# Patient Record
Sex: Female | Born: 1945 | Race: White
Health system: Southern US, Community
[De-identification: ages and names within clinical notes are randomized; demographics above are authoritative.]

## PROBLEM LIST (undated history)

## (undated) DIAGNOSIS — Z952 Presence of prosthetic heart valve: Secondary | ICD-10-CM

## (undated) HISTORY — DX: Presence of prosthetic heart valve: Z95.2

---

## 2017-07-01 IMAGING — CT CTA NECK
2 of 5 series · 13 of 46 positions shown, 15 images · IV contrast (ISOVUE 300)
Comparison: 

CTA NECK, 07/01/2017 [DATE]: 
CLINICAL INDICATION: Assess carotid artery stenosis. Outside carotid 
ultrasound 
showing 50-69% left internal carotid stenosis. Patient describes dizziness x2 
years. 
A search for DICOM formatted images was conducted for prior CT imaging studies 
completed at a non-affiliated media free facility.
TECHNIQUE: The neck was scanned from the maxillary sinuses through AP window 
with 100 cc's of Isovue 370 injected intravenously on a high resolution low 
dose 
CT scanner using dose reduction techniques.  Routine MPR and MIP 3D renderings 
were reconstructed on an independent workstation with concurrent physician 
supervision.

[Series 5: (person_name) 1.5 b26s · axial · 0.33mm/px · z∈[-316,-68]mm · 10 of 191 slices shown, 12 images]
[im 13/191  soft-tissue]
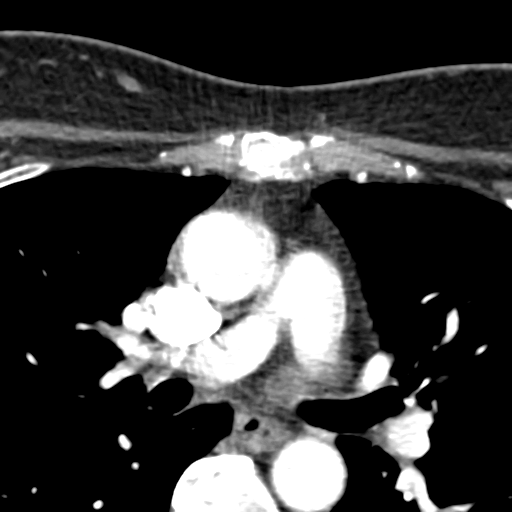
[im 13/191  bone]
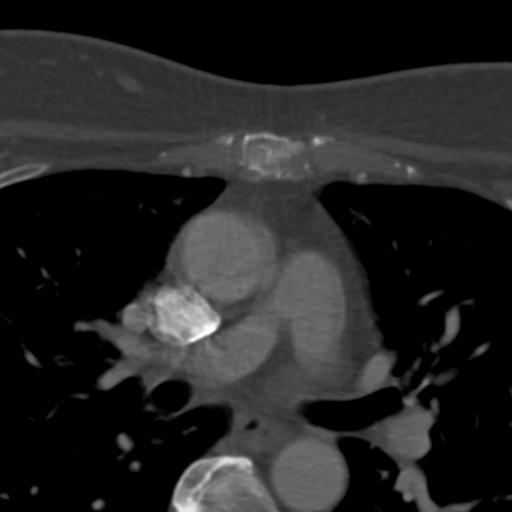
[im 31/191  soft-tissue]
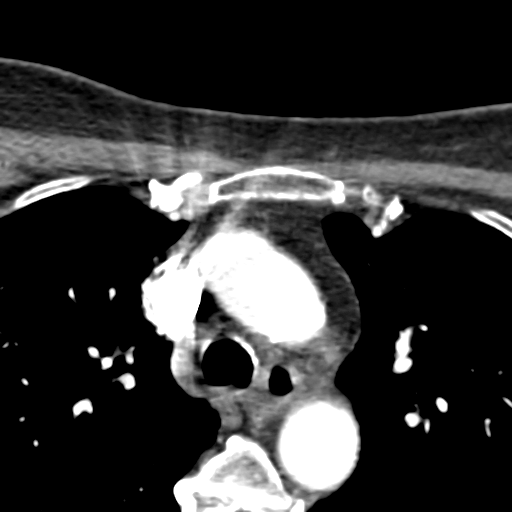
[im 50/191  soft-tissue]
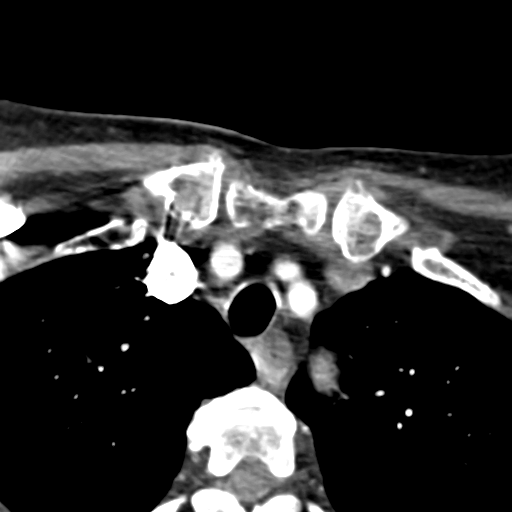
[im 68/191  soft-tissue]
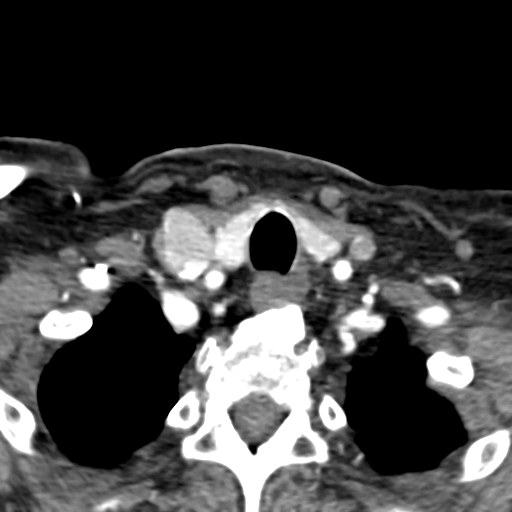
[im 86/191  soft-tissue]
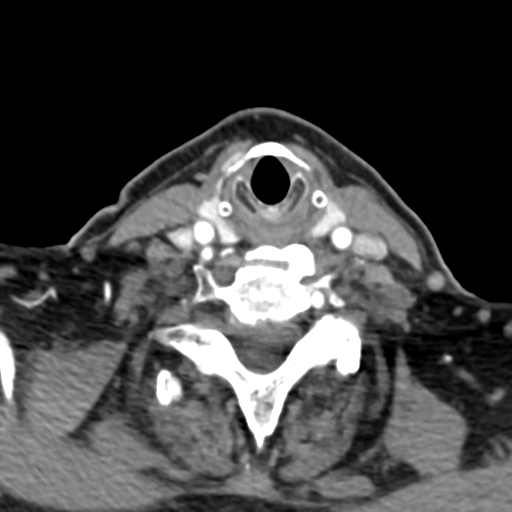
[im 105/191  soft-tissue]
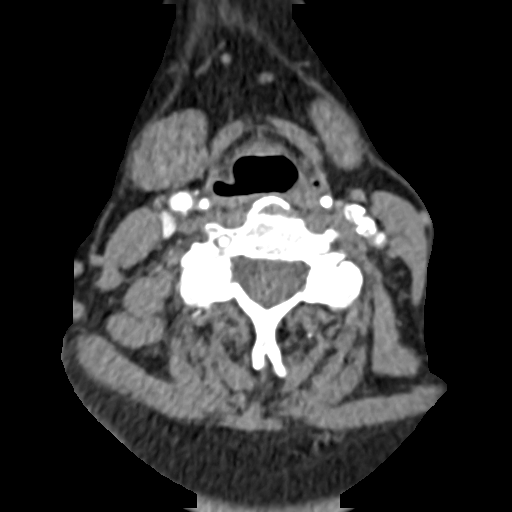
[im 123/191  soft-tissue]
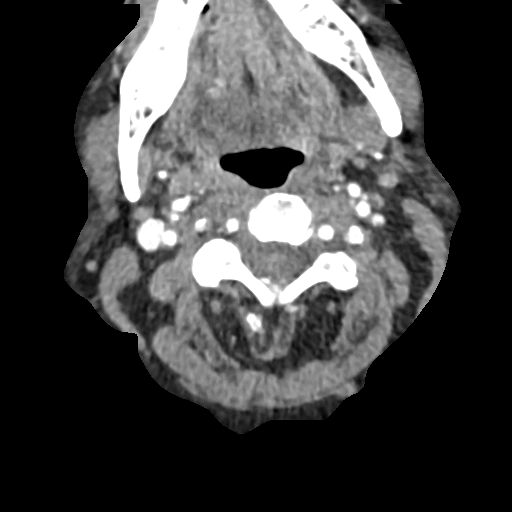
[im 141/191  soft-tissue]
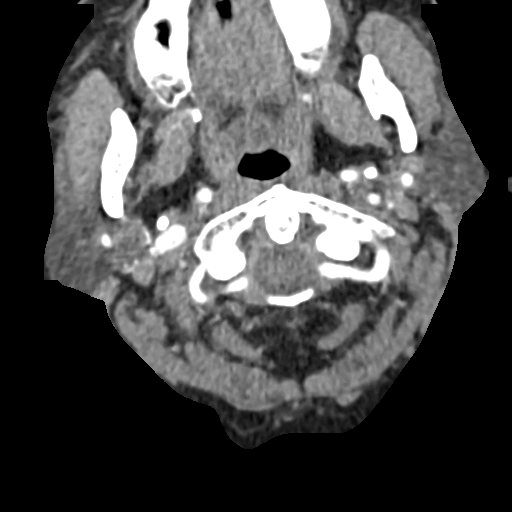
[im 160/191  soft-tissue]
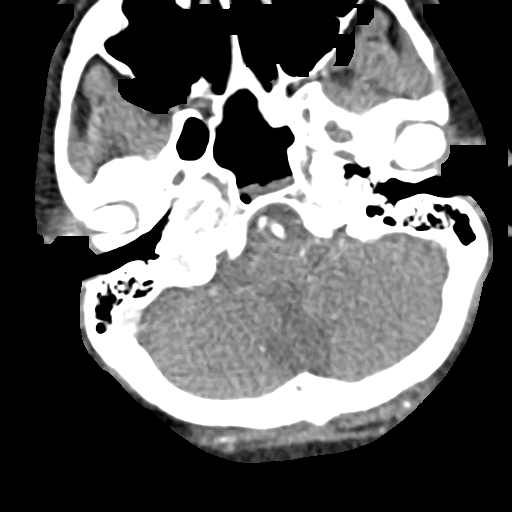
[im 160/191  bone]
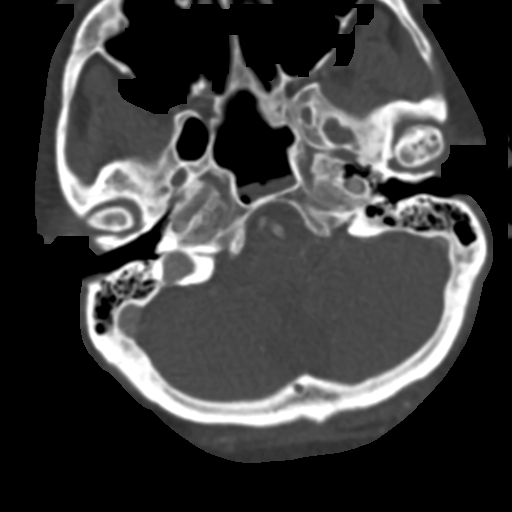
[im 178/191  soft-tissue]
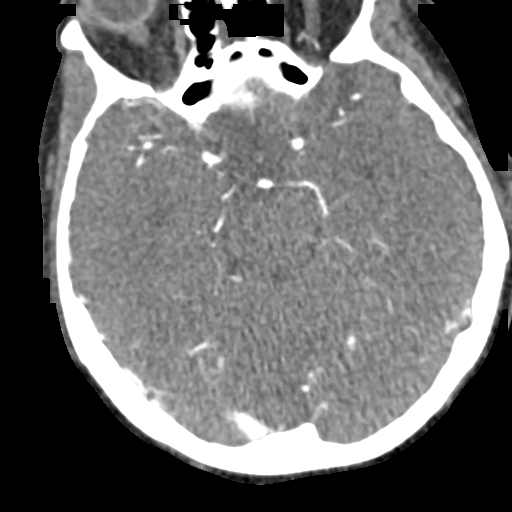

[Series 7: coronal · coronal · 0.32mm/px · 3 of 101 slices shown]
[im 34/101  soft-tissue]
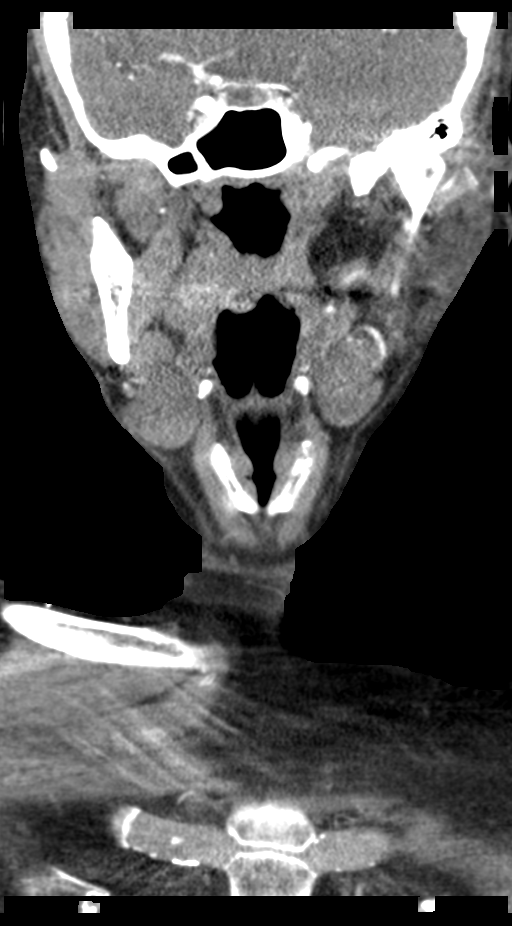
[im 45/101  soft-tissue]
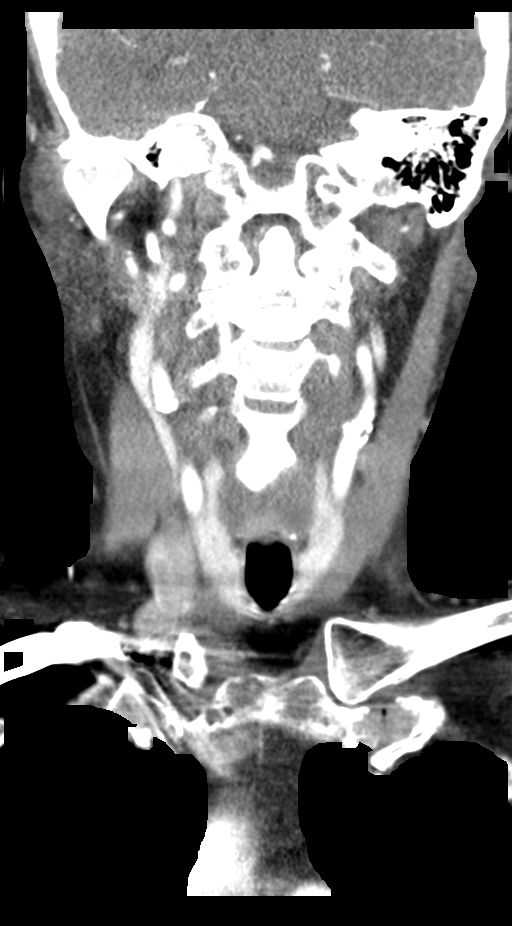
[im 56/101  soft-tissue]
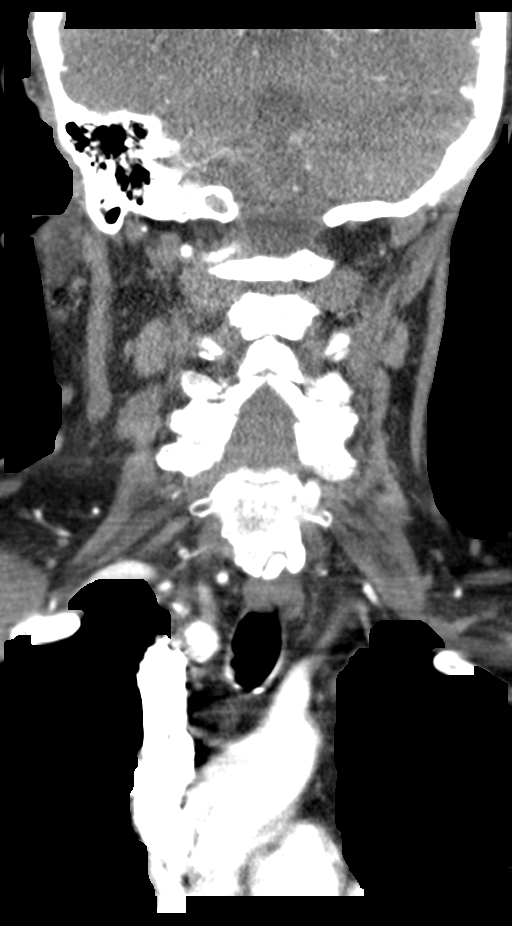

[13 of 46 positions shown; findings below may reference images not displayed]

FINDINGS: The great vessels are open. The brachiocephalic and right common 
carotid arteries are open. There is moderate partly calcified plaque involving 
the right carotid bulb, with stenosis no greater than 40% using modified 
NASCET 
criteria. Above the bulb there is antegrade flow to the skull base. There is 
moderate plaque in the right cavernous and supraclinoid segments without 
significant intracranial stenosis. The right anterior and middle cerebral 
arteries are open. 
The left common carotid artery is open. There is extensive calcific plaque 
involving the left internal carotid bulb and proximal left external carotid 
artery. Lumen diameter of the left internal carotid artery at the origin of 
the 
vessel is no greater than 1.2 mm, axial image 88, also seen on sagittal image 
35, and coronal image 46. Stenosis is estimated at 85-90%. At the upper bulb 
stenosis of 70%. Above the bulb the left internal carotid artery is open. 
There 
is antegrade flow to the skull base. There is mild to moderate plaque in the 
left cavernous segment. There is hypoplasia of the left A1 segment. The left 
middle cerebral artery is unremarkable. The left A2 is open. The anterior 
communicating artery appears open. 
The left vertebral artery is open and dominant. The right vertebral artery is 
open. There is mild to moderate calcific plaque involving the distal vertebral 
arteries best seen on axial image 41, without significant stenosis on the 
left. 
The basilar artery is open. The posterior cerebral arteries arise from the 
basilar tip. There is no evidence for intracranial aneurysm. There are mild 
mucoid secretions in the sphenoid sinus. There is cervical spondylosis. Upper 
visualized lungs are clear. Lower cranial contents are unremarkable. There is 
no 
cervical or upper mediastinal lymphadenopathy.
IMPRESSION: Hemodynamically significant stenosis of the left internal carotid bulb 
estimated 
at 85-90%. 
No greater than 40% stenosis of the right internal carotid bulb. 
Both vertebral arteries are open, the left dominant. The basilar artery is 
open. 
Mild to moderate plaque in the cavernous carotid segments, and right 
supraglenoid segment. No evidence for hemodynamically significant intracranial 
stenosis or aneurysm. Note made of hypoplasia of the left A1 segment. The 
anterior communicating artery is open. 
Mild mucoid secretions in the sphenoid sinus could reflect recent/resolving 
sinusitis. Other visualized paranasal sinuses and otomastoid spaces are clear. 
Cervical spondylosis. There does not appear to be significant cervical canal 
stenosis. 
Measurements performed utilizing modified NASCET criteria. 
RADIATION DOSE REDUCTION: All CT scans are performed using radiation dose 
reduction techniques, when applicable.  Technical factors are evaluated and 
adjusted to ensure appropriate moderation of exposure.  Automated dose 
management technology is applied to adjust the radiation doses to minimize 
exposure while achieving diagnostic quality images.

## 2019-09-18 IMAGING — CT CT TAVR
1 of 14 series · 3 of 16 positions shown, 4 images · IV contrast (ISOVUE 300)
Comparison: none

CT TAVR, 09/18/2019 [DATE] 
CLINICAL INDICATION:
TECHNIQUE: CTA of the chest, heart, abdomen, and pelvis were scanned from base of neck 
through the pubic rami with 100 CC Isovue 370, on a high resolution CT 
scanner using dose reduction techniques.? 0.75mm sections with contrast 
enhancement and maximum intensity pixel (MIPS) reconstruction. 
Dose: CTIVol 56.15 mGy 
Heart rate 63  beats per minute 
Reconstructed at 30 % of R-R interval 
Hydration: NACL 0.9%-250 cc at [DATE] 
Metroprolol 00 milligrams at 
Specific measurements for assessment for TAVR placement are as follows (measured 
at the narrowest point of the lumen of the measured segment): 
Distance of left main to annulus: 12.2 mm 
Distance of RCA to annulus: 10.3 mm 
Aortic diameter at the annulus: 27.3 mm 
Annulus circumference: mm 
Annulus area: 79.6 cm2 
Aortic diameter at level of sinus of valsalva: 32.7 mm 
Aortic diameter at the sinotubular junction: 24.1 mm 
Aortic diameter proximal ascending aorta: 28.8 mm 
Aortic diameter mid ascending aorta: 29.8 mm 
Aortic diameter distal ascending aorta: 27.2 mm 
Aortic diameter mid arch level: 22.1 mm 
Aortic diameter proximal descending aorta: 21.1 mm 
Aortic diameter distal abdominal aorta: 20.6 mm 
Right common iliac artery: 9.7 mm 
Right external iliac artery: 7.8 mm 
Right common femoral artery: 6.2 mm 
Left common iliac artery: 8.5 mm 
Left external iliac artery: 8.2 mm 
Left common femoral artery: 6.0 mm 
Aortic lumen: Mild atherosclerotic changes 
There is mild atherosclerotic changes in the aorta root. 
Nonvascular findings: 
Chest: There is minor linear scarring versus atelectasis in the right lower 
lobe, lungs are otherwise clear without mass or consolidations. No mediastinal, 
no axillary and no hilar adenopathy. Heart size normal. No pulmonary embolus and 
no thoracic aortic dissection or aneurysm. 
Abdomen: The liver, spleen, pancreas, adrenals and kidneys are normal. There are 
mild atherosclerotic changes of the abdominal aorta without aneurysm or 
stenosis. The superior mesenteric artery, celiac artery and renal arteries are 
patent. Stomach is partially distended and is difficult to assess. No 
para-aortic adenopathy. 
Pelvis: The urinary bladder is normal. Uterus and adnexa are normal. Bowel loops 
in pelvis appear normal. The iliac arteries have minor atherosclerotic changes 
without significant stenosis or tortuosity. No pelvic mass or free fluid in 
pelvis and no pelvic adenopathy. No lytic or blastic lesions are seen in the 
chest, abdomen or pelvis.

[Series 12: bodyangio 1.0 i31s 3 · axial · 0.86mm/px · z∈[-564,+154]mm · 3 of 1437 slices shown, 4 images]
[im 1/1437  soft-tissue]
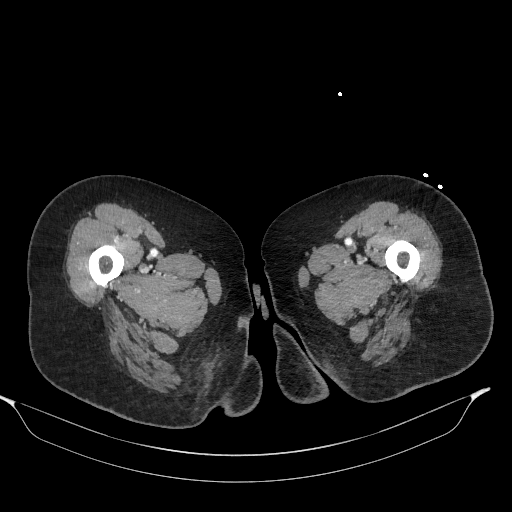
[im 1/1437  bone]
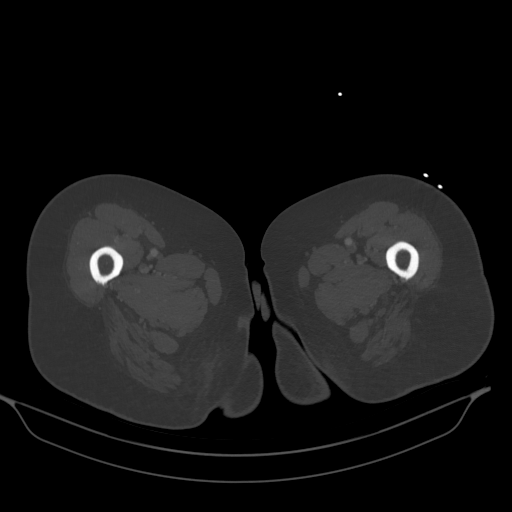
[im 719/1437  soft-tissue]
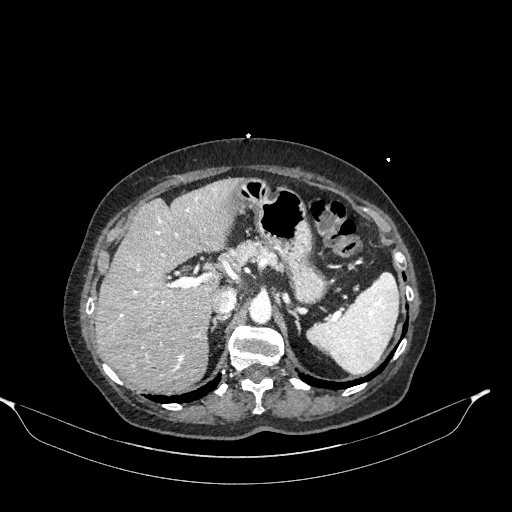
[im 1437/1437  soft-tissue]
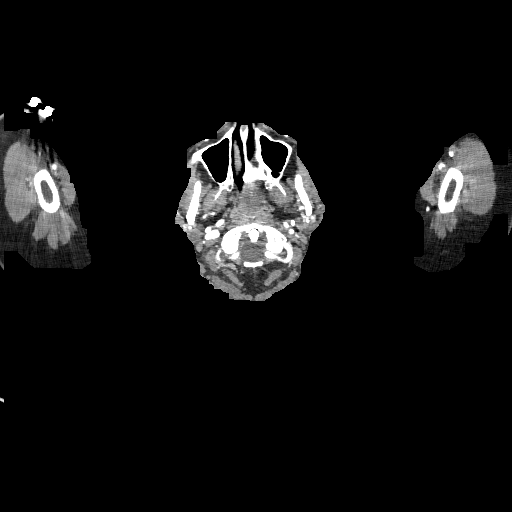

[3 of 16 positions shown; findings below may reference images not displayed]

IMPRESSION: TAVR measurements as described above. 
No stenosis or tortuosity which would preclude lower extremity access for TAVR 
procedure.

## 2021-02-12 NOTE — Progress Notes (Signed)
Name:Brenda Stevens Age: 75 y.o.   Date of Service: 02/13/2021  Referring Physician: Referring, Not On File,*   Date of Injury: 01/13/2021  Date Care Plan Established/Reviewed: 02/13/2021  Date Treatment Started: 02/13/2021  End of Certification Date: 05/13/2021  Sessions in Plan of Care: 16  Surgery Date: No data was found  MD Follow-up: No data was found  Medbridge Code: No data was found    Visit Count: 1   Diagnosis:   1. Cervical pain      *Pt goes by Brenda Stevens    Subjective     History of Present Illness   History of Present Illness: Pt reports she has mobility issues and feels like she compensates with her neck. She lives in [place] part time in winter. She has been told she has arthritis. Feels a lot of stiffness in the neck. She has fallen a few times in the last year. She does a lot of walking. Pt has previous dx of mal de debarquement, is cause of mobility issues, has no relief. She has done vestibular rehab before which didn't help. Pt reports last year and a half the neck has gotten worse overall but does have instances where get can get better or worse. Pt has N/T into the R hand, recently has become more frequent especially at night. She has cramping in legs at night. Pt has not had recently imaging done, but did have xray in the past.   Functional Limitations (PLOF): Driving/turning head- Pain and difficulty (prior no issues)  Walking- Pain and difficulty (prior no issues)  Looking up and down- Pain and difficulty (prior no issues)  Using phone and computer- Pain and difficulty (prior no issues)      Precautions: No data was found  Allergies: Patient has no allergy information on record.    No past medical history on file.      Outcomes:  Goal: 66 Initial  02/13/21             Primary Functional Status Measure 55       NDI        Pain: /10 /10 /10 /10 /10   Pain last 24 hrs        Least pain last 30 days        Greatest pain last 30 days        PSFS Activities: /10 /10 /10 /10 /10   1.         2.         3.            Cervical AROM  Initial   02/13/21         Flexion 30 p        Extension 20 p        Side Bend R 15 p       Side Bend L 5 p       Rotation R 40 p        Rotation L 40 p        (blank fields were intentionally left blank)    UE Strength  PSI Initial  R  02/13/21   Initial   L  02/13/21     R   L   R   L   R   L   R   L   Shoulder Flex 5.6 p 7.6 p           Shoulder Abd (C5) 4.6 p 5.4 p  Shoulder Ext             Shoulder ER             Shoulder IR             Rhomboids             Serratus             Upper Trap (C4) 4.6 p 6.0 p           Mid Trap (C4) sitting              Lower Trap (C4)             Biceps (C6)             Triceps (C7)             Wrist Ext (C7)             Wrist Flex (C7)             (blank fields were intentionally left blank)       Objective     Posture     Head  Forward.    Shoulders  Rounded.    Scapulae  Left protracted and right protracted.    Integumentary   no wound, lesion or rash noted    Palpation Negative spurling, positive distraction/compression  TTP and tightness SCM, scalenes, LS, UT, thoracic and cervical paraspinals  b/l   Elevated and TTP 1st rib b/l   TTP C4-6, hypomobility throughout cervical and thoracic spine  R shoulder elevated and protracted compared to L       Neurological Testing     Sensation   Cervical/Thoracic   Left   Intact: light touch    Right   Intact: light touch     BP: 132/79 Heart Rate: 67    Treatment     Therapeutic Exercises - Justified to address any of the following: develop strength, endurance, ROM and/or flexibility.   Instructed in the following for HEP:    Access Code: [email or web address]  URL: [email or web address]  Date: 02/13/2021  Prepared by: Estrellita Ludwig    Exercises  Axial elongation - 1 x daily - 7 x weekly - 3 sets - 10 reps  Seated Scapular Retraction - 1 x daily - 7 x weekly - 1 sets - 10 reps - 10 hold  Supine Cervical Rotation AROM on Pillow - 1 x daily - 7 x weekly - 3 sets - 10 reps  Seated Cervical Sidebending AROM - 1  x daily - 7 x weekly - 3 sets - 10 reps      Therapeutic Activity - Justified to address the following: activities to improve functional performance.  Pt educated on condition, anatomy, purpose of PT, timeline for recovery.        ---    Flowsheet Row ---   Total Time    Timed Minutes 20 minutes   Untimed Minutes 23 minutes   Total Time 43 minutes        Assessment   Brenda Stevens is a 75 y.o. female presenting with cervical pain who requires Physical Therapy for the following:  Impairments: Impaired posture, decreased strength, decreased ROM, joint hypomobility, muscle hypertonicity, decreased activity tolerance    Pain located: b/l SO area    Clinical presentation: stable - predictable pattern of recovery   Barriers to therapy: Time since onset of injury/illness/exacerbation - acute exacerbation of chronic issue   Comorbidities - mal  de debarquement , Meds for HBP   Functional Limitations (PLOF): Driving/turning head- Pain and difficulty (prior no issues)  Walking- Pain and difficulty (prior no issues)  Looking up and down- Pain and difficulty (prior no issues)  Using phone and computer- Pain and difficulty (prior no issues)  Patient is aware of diagnosis, prognosis and consents to plan of care: Yes  Plan   Visits per week: 2  Number of Sessions: 16  Direct One on One  21308: Therapeutic Exercise: To Develop Strength and Endurance, ROM and Flexibility  [id number]: Gait Training  65784: Neuromuscular Reeducation  97140: Manual Therapy techniques (mobilization, manipulation, manual traction) (STM: cervical and shoulder muscles, Joint mobs: cervical, shoulder, scapular, all palnes grade 1-4 )  97530: Therapeutic Activities: Dynamic activities to improve functional performance  Dry Needling  Supervised Modalities  97010: Thermal modalities: hot/cold packs  69629: Electrical stimulation  97016: Vasopneumatic devices  Next Session: gentle cervical mobility, joint mobs cervical and thoracic, STM cervical muscle, scapular stability  and deep neck flexor exercises, assess ULTT       Goals    Goal 1: Improve Physical FS Primary Measure from 55 to 66 or better to demonstrate change for significant functional improvement.      Sessions: 16      Goal 2: Patient will demonstrate independence in prescribed HEP with proper form, sets and reps for safe discharge to an independent program.   Sessions: 16      Goal 3: Patient will demonstrate cervical rotation AROM of greater than or equal to 70 degrees bilaterally to increase visual field to allow patient to check blind spots safely, change lanes safely, and back out of parking spots without pain         Sessions: 16      Goal 4: Patient will demonstrate >15 PSI of flexion and abduction to improve dynamic stability to allow patient to lift groceries from floor without pain         Sessions: 16                                Estrellita Ludwig, DPT

## 2021-02-13 ENCOUNTER — Inpatient Hospital Stay: Payer: Medicare Other | Admitting: Physical Therapist

## 2021-02-13 DIAGNOSIS — M542 Cervicalgia: Secondary | ICD-10-CM | POA: Insufficient documentation

## 2021-02-17 NOTE — PT/OT Therapy Note (Signed)
Name:Brenda Stevens Age: 75 y.o.   Date of Service: 02/18/2021  Referring Physician: Referring, Not On File,*   Date of Injury: 01/13/2021  Date Care Plan Established/Reviewed: 02/13/2021  Date Treatment Started: 02/13/2021  End of Certification Date: 05/13/2021  Sessions in Plan of Care: 16  Surgery Date: No data was found  MD Follow-up: No data was found  Medbridge Code: No data was found    Visit Count: 2   Diagnosis:   1. Cervical pain        Subjective     Daily Subjective   Pt reports that she was really sore after IE. After that she feels like stretching did help.     Social Support/Occupation      Occupation: retired      Precautions: No data was found  Allergies: Patient has no allergy information on record.    No past medical history on file.    Initial Evaluation Reference and/or Current Measurements(as dated):    Outcomes:  Goal: 66 Initial  02/13/21             Primary Functional Status Measure 55       NDI        Pain: /10 /10 /10 /10 /10   Pain last 24 hrs        Least pain last 30 days        Greatest pain last 30 days        PSFS Activities: /10 /10 /10 /10 /10   1.         2.         3.           Cervical AROM  Initial   02/13/21         Flexion 30 p        Extension 20 p        Side Bend R 15 p       Side Bend L 5 p       Rotation R 40 p        Rotation L 40 p        (blank fields were intentionally left blank)    UE Strength  PSI Initial  R  02/13/21   Initial   L  02/13/21     R   L   R   L   R   L   R   L   Shoulder Flex 5.6 p 7.6 p           Shoulder Abd (C5) 4.6 p 5.4 p           Shoulder Ext             Shoulder ER             Shoulder IR             Rhomboids             Serratus             Upper Trap (C4) 4.6 p 6.0 p           Mid Trap (C4) sitting              Lower Trap (C4)             Biceps (C6)              Triceps (C7)  Wrist Ext (C7)             Wrist Flex (C7)             (blank fields were intentionally left blank)       Objective     Posture     Head  Forward.    Shoulders  Rounded.    Scapulae  Left protracted and right protracted.    Integumentary   no wound, lesion or rash noted    Palpation Negative spurling, positive distraction/compression  TTP and tightness SCM, scalenes, LS, UT, thoracic and cervical paraspinals  b/l   Elevated and TTP 1st rib b/l   TTP C4-6, hypomobility throughout cervical and thoracic spine  R shoulder elevated and protracted compared to L                   Treatment     Therapeutic Exercises - Justified to address any of the following: develop strength, endurance, ROM and/or flexibility.   UBE L3 X 6 min (3 min fwd/3 min backwards) w/subjective discussion to guide treatment session    Added:  Side lying open book x 10each side, VC for head following hand   Side lying shoulder ER, ab, flex #1 x 10, b/l, VC for scapular positioning     Performed and reviewed HEP for compliance and proper performance:    Axial elongation - 1 x daily - 7 x weekly - 3 sets - 10 reps  Seated Scapular Retraction - 1 x daily - 7 x weekly - 1 sets - 10 reps - 10 hold  Supine Cervical Rotation AROM on Pillow - 1 x daily - 7 x weekly - 3 sets - 10 reps  Seated Cervical Sidebending AROM - 1 x daily - 7 x weekly - 3 sets - 10 reps    Manual Therapy - Justified to address any of the following:  Mobilization of joints and soft tissues, manipulation, manual lymphatic drainage, and/or manual traction.    Supine:    PPM cervical rotation and sidebending grade 3 with manual scapular depression   STM b/l UT, LS, cervical extensors, SCM, scalene, SO  SOR with manual cervical traction grade 3   1st rib mob inf grade 3 b/l         Home Exercises   Access Code: [email or web address]  URL: [email or web address]  Date: 02/13/2021  Prepared by: Estrellita Ludwig    Exercises  Axial elongation - 1 x daily - 7 x weekly - 3 sets - 10 reps  Seated Scapular Retraction - 1 x daily - 7 x weekly - 1 sets - 10 reps - 10 hold  Supine Cervical Rotation AROM on Pillow - 1 x daily - 7 x weekly - 3 sets - 10 reps  Seated Cervical Sidebending AROM - 1 x daily - 7 x weekly - 3 sets - 10 reps       ---    Flowsheet Row ---   Total Time    Timed Minutes 40 minutes   Total Time 40 minutes        Assessment   Pt educated on importance of daily/consistent performance of HEP in order to reach goals and return to PLOF.   Tightness b/l UT, LS, and SO improved post MT  Pt appropriately challenged side lying scapular stability exercises, good carryover with VC/TC for scapular retraction and depression  Noted limited ER ROM on R   Pt would benefit from continued skilled PT  to address impairments noted above and progress towards functional goals in order to return to PLOF.     Plan   Continue per POC- continue STM, cervical mobility, deep neck flexor and scapular stability exercises       Goals    Goal 1: Improve Physical FS Primary Measure from 55 to 66 or better to demonstrate change for significant functional improvement.      Sessions: 16      Goal 2: Patient will demonstrate independence in prescribed HEP with proper form, sets and reps for safe discharge to an independent program.   Sessions: 16      Goal 3: Patient will demonstrate cervical rotation AROM of greater than or equal to 70 degrees bilaterally to increase visual field to allow patient to check blind spots safely, change lanes safely, and back out of parking spots without pain         Sessions: 16      Goal 4: Patient will demonstrate >15 PSI of flexion and abduction to improve dynamic stability to allow patient to lift groceries from floor without pain         Sessions: 16                                Estrellita Ludwig, DPT

## 2021-02-18 ENCOUNTER — Inpatient Hospital Stay: Payer: Medicare Other | Admitting: Physical Therapist

## 2021-02-18 DIAGNOSIS — M542 Cervicalgia: Secondary | ICD-10-CM

## 2021-02-25 ENCOUNTER — Inpatient Hospital Stay: Payer: Medicare Other

## 2021-02-25 DIAGNOSIS — M542 Cervicalgia: Secondary | ICD-10-CM

## 2021-02-25 NOTE — Addendum Note (Signed)
Addended by: Estrellita Ludwig T. on: 02/25/2021 12:03 PM     Modules accepted: Orders

## 2021-02-25 NOTE — PT/OT Therapy Note (Signed)
Name:Brenda Stevens Age: 75 y.o.   Date of Service: 02/25/2021  Referring Physician: Bertram Gala,*   Date of Injury: 01/13/2021  Date Care Plan Established/Reviewed: 02/13/2021  Date Treatment Started: 02/13/2021  End of Certification Date: 05/13/2021  Sessions in Plan of Care: 16  Surgery Date: No data was found  MD Follow-up: No data was found  Medbridge Code: No data was found    Visit Count: 3   Diagnosis:   1. Cervical pain        Subjective     Daily Subjective   Pt reports she feels like things have got a little better, still stiffens up on the R side of the neck L side has been good.     Social Support/Occupation              Occupation: retired    Precautions: No data was found  Allergies: Patient has no allergy information on record.    No past medical history on file.    Initial Evaluation Reference and/or Current Measurements(as dated):    Outcomes:  Goal: 66 Initial  02/13/21                    Primary Functional Status Measure 55           NDI             Pain: /10 /10 /10 /10 /10   Pain last 24 hrs             Least pain last 30 days             Greatest pain last 30 days             PSFS Activities: /10 /10 /10 /10 /10   1.              2.              3.                 Cervical AROM  Initial   02/13/21              Flexion 30 p            Extension 20 p            Side Bend R 15 p           Side Bend L 5 p           Rotation R 40 p            Rotation L 40 p            (blank fields were intentionally left blank)     UE Strength  PSI Initial  R  02/13/21    Initial   L  02/13/21       R    L    R    L    R    L    R    L   Shoulder Flex 5.6 p 7.6 p                   Shoulder Abd (C5) 4.6 p 5.4 p                   Shoulder Ext                       Shoulder ER  Shoulder IR                       Rhomboids                       Serratus                       Upper Trap (C4) 4.6 p 6.0 p                   Mid Trap (C4) sitting                        Lower Trap (C4)                        Biceps (C6)                       Triceps (C7)                       Wrist Ext (C7)                       Wrist Flex (C7)                       (blank fields were intentionally left blank)         Objective      Posture      Head  Forward.     Shoulders  Rounded.     Scapulae  Left protracted and right protracted.     Integumentary   no wound, lesion or rash noted     Palpation Negative spurling, positive distraction/compression  TTP and tightness SCM, scalenes, LS, UT, thoracic and cervical paraspinals  b/l   Elevated and TTP 1st rib b/l   TTP C4-6, hypomobility throughout cervical and thoracic spine  R shoulder elevated and protracted compared to L                    Treatment     Therapeutic Exercises - Justified to address any of the following:  To develop strength, endurance, ROM and/or flexibility.   UBE L3 X 6 min (3 min fwd/3 min backwards) w/subjective discussion to guide treatment session    - scap retract (row) & PD w/Red TB 5 sec hold x 30 each with ensuring techniques with cues needed from NMR    Hook lying LT activation 5sec hold x20 ensuring techniques with cues needed from NMR       Review of HEP from previous note       Neuromuscular Re-Education - Justified to address any of the following:   Re-education of movement, balance, coordination, kinesthetic sense, posture and/or proprioception for sitting and/or standing activities.   verbal & tactile cues to facilitate scap depression & retraction for reaching overhead and out to side without impingement.    Tactile cues on pt shoulder to facilitate B shoulder depression and retraction for LT activation     Manual Therapy - Justified to address any of the following:    Mobilization of joints and soft tissues, manipulation, manual lymphatic drainage, and/or manual traction.    Supine:    PPM cervical rotation and sidebending grade 3 with manual scapular depression   STM b/l UT, LS, cervical extensors, SCM, scalene,  SO  SOR with manual cervical  traction grade 3   1st rib mob inf grade 3 b/l         Home Exercises   Access Code: [email or web address]  URL: [email or web address]  Date: 02/25/2021  Prepared by: Trinda Pascal    Exercises  Prone Scapular Slide with Shoulder Extension - 1 x daily - 7 x weekly - 1 sets - 20 reps - 5 hold  Standing Backward Shoulder Shrug with Dumbbells - 1 x daily - 7 x weekly - 3 sets - 15 reps  Sidelying Thoracic Rotation with Open Book - 1 x daily - 7 x weekly - 1 sets - 10 reps  Shoulder Extension with Resistance - 1 x daily - 7 x weekly - 3 sets - 10 reps  Standing Row with Anchored Resistance - 1 x daily - 7 x weekly - 3 sets - 10 reps  Supine Lower Trapezius Strengthening - 1 x daily - 7 x weekly - 1 sets - 20 reps - 5 hold       ---      Flowsheet Row ---   Total Time    Timed Minutes 38 minutes   Total Time 38 minutes          Assessment   Increased R shoulder elevation compared to L which was addressed with treatment with cues during strengthening, progressed HEP and manual techniques. Improve R ER compared to initial visit in supine.     Pt would benefit from continued skilled PT to address impairments noted above and progress towards functional goals in order to return to PLOF.     Plan   Continue per POC-   continue STM, cervical mobility, deep neck flexor and scapular stability exercises     Goals      Goal 1: Improve Physical FS Primary Measure from 55 to 66 or better to demonstrate change for significant functional improvement.      Sessions: 16      Goal 2: Patient will demonstrate independence in prescribed HEP with proper form, sets and reps for safe discharge to an independent program.   Sessions: 16      Goal 3: Patient will demonstrate cervical rotation AROM of greater than or equal to 70 degrees bilaterally to increase visual field to allow patient to check blind spots safely, change lanes safely, and back out of parking spots without pain         Sessions: 16      Goal 4: Patient will demonstrate >15 PSI of  flexion and abduction to improve dynamic stability to allow patient to lift groceries from floor without pain         Sessions: 16                                  Dereck Ligas, PTA

## 2021-02-26 NOTE — PT/OT Therapy Note (Signed)
Name:Brenda Stevens Age: 75 y.o.   Date of Service: 02/27/2021  Referring Physician: Bertram Gala,*   Date of Injury: 01/13/2021  Date Care Plan Established/Reviewed: 02/13/2021  Date Treatment Started: 02/13/2021  End of Certification Date: 05/13/2021  Sessions in Plan of Care: 16  Surgery Date: No data was found  MD Follow-up: No data was found  Medbridge Code: No data was found    Visit Count: 4   Diagnosis:   1. Cervical pain        Subjective     Daily Subjective   Pt reports she felt much better after last session and like the muscles around her neck loosened up. However after a couple days it stiffened back up.   She has noticed some improvements in turning her head when she is driving.     Social Support/Occupation              Occupation: retired    Precautions: No data was found  Allergies: Patient has no allergy information on record.    No past medical history on file.    Initial Evaluation Reference and/or Current Measurements(as dated):    Outcomes:  Goal: 66 Initial  06/09                    Primary Functional Status Measure 55           NDI 28            Pain: /10 /10 /10 /10 /10   Pain last 24 hrs 5            Least pain last 30 days  8           Greatest pain last 30 days  8           PSFS Activities: /10 /10 /10 /10 /10   1. mobility, steps  5           2. posture  5           3. lifting, carrying  5              Posture   IE 02/13/21: forward head, rounded shoulders, protracted scapulae     Palpation   IE 02/13/21: Negative spurling, positive distraction/compression; TTP and tightness SCM, scalenes, LS, UT, thoracic and cervical paraspinals  b/l; Elevated and TTP 1st rib b/l; TTP C4-6, hypomobility throughout cervical and thoracic spine; R shoulder elevated and protracted compared to L      Cervical AROM  Initial   06/09              Flexion 30 p            Extension 20 p            Side Bend R 15 p           Side Bend L 5 p           Rotation R 40 p            Rotation L 40 p             (blank fields were intentionally left blank)     UE Strength  PSI Initial R  06/09 Initial L  06/09       R    L    R    L    R    L    R    L  Shoulder Flex 5.6 p 7.6 p                   Shoulder Abd (C5) 4.6 p 5.4 p                   Shoulder Ext                       Shoulder ER                       Shoulder IR                       Rhomboids                       Serratus                       Upper Trap (C4)                     Mid Trap (C4) sitting   4.6 p 6.0 p                    Lower Trap (C4)                       Biceps (C6)                       Triceps (C7)                       Wrist Ext (C7)                       Wrist Flex (C7)                       (blank fields were intentionally left blank)                 Treatment     Therapeutic Exercises - Justified to address any of the following:  To develop strength, endurance, ROM and/or flexibility.   - UBE, level 2, forward and backward, 3 minutes each, with subjective discussion and plan for today's session  - Sidelying thoracic rotation, x15 B  Added:  - B shoulder ext, GTB, 2x15  - Reactive isometrics for shoulder ER, RTB, x10 B, verbal cueing for technique  - Hooklying B shoulder abd, RTB, 2x15, verbal cueing for slow controlled motion    Not performed today (maintained for reference):  - Hook lying LT activation 5sec hold x20    Neuromuscular Re-Education - Justified to address any of the following:   Re-education of movement, balance, coordination, kinesthetic sense, posture and/or proprioception for sitting and/or standing activities.   - Rows, RTB, 2x15, tactile cueing for adequate scapular retraction and posterior depression  Added:  - B shoulder ER, RTB, 2x10, verbal/tactile cueing for mid trap activation     Manual Therapy - Justified to address any of the following:    Mobilization of joints and soft tissues, manipulation, manual lymphatic drainage, and/or manual traction.    Supine:  - STM B upper traps, levator scap, cervical  paraspinals  - SOR  - Manual cervical traction, 30 sec x4  - First rib inf mob, grIV, 20 sec x3 B    Not  performed today (maintained for reference):  - PPM cervical rotation and sidebending grade 3 with manual scapular depression    Home Exercises   Access Code: [email or web address]  URL: [email or web address]  Date: 02/25/2021  Prepared by: Trinda Pascal    Exercises  Prone Scapular Slide with Shoulder Extension - 1 x daily - 7 x weekly - 1 sets - 20 reps - 5 hold  Standing Backward Shoulder Shrug with Dumbbells - 1 x daily - 7 x weekly - 3 sets - 15 reps  Sidelying Thoracic Rotation with Open Book - 1 x daily - 7 x weekly - 1 sets - 10 reps  Shoulder Extension with Resistance - 1 x daily - 7 x weekly - 3 sets - 10 reps  Standing Row with Anchored Resistance - 1 x daily - 7 x weekly - 3 sets - 10 reps  Supine Lower Trapezius Strengthening - 1 x daily - 7 x weekly - 1 sets - 20 reps - 5 hold       ---      Flowsheet Row ---   Total Time    Timed Minutes 38 minutes   Total Time 38 minutes            Assessment   Pt challenged with progression of UE strengthening exercises. Continued increased resting tissue tension in B upper traps, levator scaps and suboccipitals, addressed with manual therapy. Pt reports decreased stiffness with cervical rotation at end of session. Encouraged pt to continue consistent performance of HEP while traveling over the next week. Pt will benefit from continued skilled outpatient PT to address impairments and progress towards functional goals.   Plan   Continue POC  continue STM, cervical mobility, deep neck flexor and scapular stability exercises     Goals      Goal 1: Improve Physical FS Primary Measure from 55 to 66 or better to demonstrate change for significant functional improvement.      Sessions: 16      Goal 2: Patient will demonstrate independence in prescribed HEP with proper form, sets and reps for safe discharge to an independent program.   Sessions: 16      Goal 3: Patient will  demonstrate cervical rotation AROM of greater than or equal to 70 degrees bilaterally to increase visual field to allow patient to check blind spots safely, change lanes safely, and back out of parking spots without pain         Sessions: 16      Goal 4: Patient will demonstrate >15 PSI of flexion and abduction to improve dynamic stability to allow patient to lift groceries from floor without pain         Sessions: 16                                  Marin Olp, DPT

## 2021-02-27 ENCOUNTER — Inpatient Hospital Stay: Payer: Medicare Other | Admitting: Physical Therapist

## 2021-02-27 DIAGNOSIS — M542 Cervicalgia: Secondary | ICD-10-CM

## 2021-03-11 ENCOUNTER — Inpatient Hospital Stay: Payer: Medicare Other | Admitting: Rehabilitative and Restorative Service Providers"

## 2021-03-11 DIAGNOSIS — M542 Cervicalgia: Secondary | ICD-10-CM | POA: Insufficient documentation

## 2021-03-11 NOTE — PT/OT Therapy Note (Signed)
Name:Brenda Stevens Age: 75 y.o.   Date of Service: 03/11/2021  Referring Physician: Bertram Gala,*   Date of Injury: 01/13/2021  Date Care Plan Established/Reviewed: 02/13/2021  Date Treatment Started: 02/13/2021  End of Certification Date: 05/13/2021  Sessions in Plan of Care: 16  Surgery Date: No data was found  MD Follow-up: No data was found  Medbridge Code: No data was found    Visit Count: 5   Diagnosis:   1. Cervical pain        Subjective     Daily Subjective   Pt reports she felt "awful" driving back from [place] because she was in the car for 7 hours. Reports only doing exercises twice over week long vacation. States that she is feeling a little bit of numbness in the R 3rd digit upon awakening this morning.    Social Support/Occupation              Occupation: retired    Precautions: No data was found  Allergies: Patient has no allergy information on record.    No past medical history on file.    Initial Evaluation Reference and/or Current Measurements(as dated):    Outcomes:  Goal: 66 Initial  06/09                    Primary Functional Status Measure 55           NDI 28            Pain: /10 /10 /10 /10 /10   Pain last 24 hrs 5            Least pain last 30 days  8           Greatest pain last 30 days  8           PSFS Activities: /10 /10 /10 /10 /10   1. mobility, steps  5           2. posture  5           3. lifting, carrying  5              Posture   IE 02/13/21: forward head, rounded shoulders, protracted scapulae     Palpation   IE 02/13/21: Negative spurling, positive distraction/compression; TTP and tightness SCM, scalenes, LS, UT, thoracic and cervical paraspinals  b/l; Elevated and TTP 1st rib b/l; TTP C4-6, hypomobility throughout cervical and thoracic spine; R shoulder elevated and protracted compared to L      Cervical AROM  Initial   06/09     7/5         Flexion 30 p            Extension 20 p            Side Bend R 15 p           Side Bend L 5 p  15 deg, nil pain         Rotation R 40 p             Rotation L 40 p            (blank fields were intentionally left blank)     UE Strength  PSI Initial R  06/09 Initial L  06/09       R    L    R    L    R    L    R  L   Shoulder Flex 5.6 p 7.6 p                   Shoulder Abd (C5) 4.6 p 5.4 p                   Shoulder Ext                       Shoulder ER                       Shoulder IR                       Rhomboids                       Serratus                       Upper Trap (C4)                     Mid Trap (C4) sitting   4.6 p 6.0 p                    Lower Trap (C4)                       Biceps (C6)                       Triceps (C7)                       Wrist Ext (C7)                       Wrist Flex (C7)                       (blank fields were intentionally left blank)      Objective                   Treatment     Therapeutic Exercises - Justified to address any of the following:  To develop strength, endurance, ROM and/or flexibility.   Supine R UT stretch, 4x30" with scap depression     Hold relax technique for R cervical    Seated middle scalene isometrics, 2x5x5" holds: Min verbal cues to "meet resistance" to allow for autogenic inhibition     Post assessment of cervical spine range of motion to determine effectiveness of interventions    Not today:     - UBE, level 2, forward and backward, 3 minutes each, with subjective discussion and plan for today's session  - Sidelying thoracic rotation, x15 B  Added:  - B shoulder ext, GTB, 2x15  - Reactive isometrics for shoulder ER, RTB, x10 B, verbal cueing for technique  - Hooklying B shoulder abd, RTB, 2x15, verbal cueing for slow controlled motion    Not performed today (maintained for reference):  - Hook lying LT activation 5sec hold x20    Neuromuscular Re-Education - Justified to address any of the following:   Re-education of movement, balance, coordination, kinesthetic sense, posture and/or proprioception for sitting and/or standing activities.   Added: Standing A's with red TB, 3x10  reps: Mod verbal cues for elbow extension and supination of BUE for increased post cuff facilitation;  focusing on scap stabilizer and RC cuff endurance to reduce forward head posture    Not today:     Rows, RTB, 2x15, tactile cueing for adequate scapular retraction and posterior depression  Added:  - B shoulder ER, RTB, 2x10, verbal/tactile cueing for mid trap activation     Manual Therapy - Justified to address any of the following:    Mobilization of joints and soft tissues, manipulation, manual lymphatic drainage, and/or manual traction.    Supine:    STM to B cervical paraspinals, suboccipital, R middle and anterior scalene    Not today:     - STM B upper traps, levator scap, cervical paraspinals  - SOR  - Manual cervical traction, 30 sec x4  - First rib inf mob, grIV, 20 sec x3 B    Not performed today (maintained for reference):  - PPM cervical rotation and sidebending grade 3 with manual scapular depression    Home Exercises   Access Code: [email or web address]  URL: [email or web address]  Date: 02/25/2021  Prepared by: Trinda Pascal    Exercises  Prone Scapular Slide with Shoulder Extension - 1 x daily - 7 x weekly - 1 sets - 20 reps - 5 hold  Standing Backward Shoulder Shrug with Dumbbells - 1 x daily - 7 x weekly - 3 sets - 15 reps  Sidelying Thoracic Rotation with Open Book - 1 x daily - 7 x weekly - 1 sets - 10 reps  Shoulder Extension with Resistance - 1 x daily - 7 x weekly - 3 sets - 10 reps  Standing Row with Anchored Resistance - 1 x daily - 7 x weekly - 3 sets - 10 reps  Supine Lower Trapezius Strengthening - 1 x daily - 7 x weekly - 1 sets - 20 reps - 5 hold       ---      Flowsheet Row ---   Total Time    Timed Minutes 32 minutes   Total Time 32 minutes            Assessment   Pt arriving 10 minutes late to appointment. Mod tone of cervical paraspinals and UT evident. UT stretches attempted with pt reporting initial pain in middle scalene region. Abated with  hold relax techniques in supine with  positive improvement with L SB in seated postures. Standing scap stabilizer strengthening exercises progressed for increased post cuff demand. Pt able to elicit good activation with cueing today; however, bicep coupling strategies remain evident. Pt given cervical SB isometrics to be performed at home for maintained mobility.      Pt challenged with progression of UE strengthening exercises. Continued increased resting tissue tension in B upper traps, levator scaps and suboccipitals, addressed with manual therapy. Pt reports decreased stiffness with cervical rotation at end of session. Encouraged pt to continue consistent performance of HEP while traveling over the next week. Pt will benefit from continued skilled outpatient PT to address impairments and progress towards functional goals.   Plan   Continue POC  continue STM, cervical mobility, deep neck flexor and scapular stability exercises     Goals      Goal 1: Improve Physical FS Primary Measure from 55 to 66 or better to demonstrate change for significant functional improvement.      Sessions: 16      Goal 2: Patient will demonstrate independence in prescribed HEP with proper form, sets and reps for safe discharge to an independent program.   Sessions: 16  Goal 3: Patient will demonstrate cervical rotation AROM of greater than or equal to 70 degrees bilaterally to increase visual field to allow patient to check blind spots safely, change lanes safely, and back out of parking spots without pain         Sessions: 16      Goal 4: Patient will demonstrate >15 PSI of flexion and abduction to improve dynamic stability to allow patient to lift groceries from floor without pain         Sessions: 16                                  Newton Pigg, DPT

## 2021-03-12 NOTE — PT/OT Therapy Note (Signed)
Name:Brenda Stevens Age: 75 y.o.   Date of Service: 03/13/2021  Referring Physician: Bertram Gala,*   Date of Injury: 01/13/2021  Date Care Plan Established/Reviewed: 02/13/2021  Date Treatment Started: 02/13/2021  End of Certification Date: 05/13/2021  Sessions in Plan of Care: 16  Surgery Date: No data was found  MD Follow-up: No data was found  Medbridge Code: No data was found    Visit Count: 6   Diagnosis:   1. Cervical pain        Subjective     Daily Subjective   Pt reports neck had been feeling better the last few days but woke up with pain this morning.     Social Support/Occupation              Occupation: retired    Precautions: No data was found  Allergies: Patient has no allergy information on record.    No past medical history on file.    Initial Evaluation Reference and/or Current Measurements(as dated):    Outcomes:  Goal: 66 Initial  06/09                    Primary Functional Status Measure 55           NDI 28            Pain: /10 /10 /10 /10 /10   Pain last 24 hrs 5            Least pain last 30 days  8           Greatest pain last 30 days  8           PSFS Activities: /10 /10 /10 /10 /10   1. mobility, steps  5           2. posture  5           3. lifting, carrying  5              Posture   IE 02/13/21: forward head, rounded shoulders, protracted scapulae     Palpation   IE 02/13/21: Negative spurling, positive distraction/compression; TTP and tightness SCM, scalenes, LS, UT, thoracic and cervical paraspinals  b/l; Elevated and TTP 1st rib b/l; TTP C4-6, hypomobility throughout cervical and thoracic spine; R shoulder elevated and protracted compared to L      Cervical AROM  Initial   06/09     7/5         Flexion 30 p            Extension 20 p            Side Bend R 15 p           Side Bend L 5 p  15 deg, nil pain         Rotation R 40 p            Rotation L 40 p            (blank fields were intentionally left blank)     UE Strength  PSI Initial R  06/09 Initial L  06/09       R    L    R     L    R    L    R    L   Shoulder Flex 5.6 p 7.6 p  Shoulder Abd (C5) 4.6 p 5.4 p                   Shoulder Ext                       Shoulder ER                       Shoulder IR                       Rhomboids                       Serratus                       Upper Trap (C4)                     Mid Trap (C4) sitting   4.6 p 6.0 p                    Lower Trap (C4)                       Biceps (C6)                       Triceps (C7)                       Wrist Ext (C7)                       Wrist Flex (C7)                       (blank fields were intentionally left blank)      Objective                   Treatment     Therapeutic Exercises - Justified to address any of the following:  To develop strength, endurance, ROM and/or flexibility.   - UBE, level 2, forward and backward, 3 minutes each, with subjective discussion and plan for today's session    Added: quadruped cat camel  x 1 min and thoracic rotation x 15 each, VC for full ROM    Not today:     Supine R UT stretch, 4x30" with scap depression   Seated middle scalene isometrics, 2x5x5" holds: Min verbal cues to "meet resistance" to allow for autogenic inhibition  - Sidelying thoracic rotation, x15 B  - Reactive isometrics for shoulder ER, RTB, x10 B, verbal cueing for technique  - Hooklying B shoulder abd, RTB, 2x15, verbal cueing for slow controlled motion  - Hook lying LT activation 5sec hold x20    Neuromuscular Re-Education - Justified to address any of the following:   Re-education of movement, balance, coordination, kinesthetic sense, posture and/or proprioception for sitting and/or standing activities.   Rows and pull down ,GTB, 2x15, tactile cueing for adequate scapular retraction and posterior depression    Progressed: standing B shoulder ER and horizontal abduction, GTB, 2x10, verbal/tactile cueing for mid trap activation     Not today:   Standing A's with red TB, 3x10 reps: Mod verbal cues for elbow extension and supination  of BUE for increased post cuff facilitation; focusing on scap stabilizer and RC cuff endurance to reduce forward  head posture      Manual Therapy - Justified to address any of the following:    Mobilization of joints and soft tissues, manipulation, manual lymphatic drainage, and/or manual traction.    Supine:    STM to B cervical paraspinals, suboccipital, R middle and anterior scalene, SCM    Not today:   - SOR  - Manual cervical traction, 30 sec x4  - First rib inf mob, grIV, 20 sec x3 B  - PPM cervical rotation and sidebending grade 3 with manual scapular depression    Home Exercises   Access Code: [email or web address]  URL: [email or web address]  Date: 02/25/2021  Prepared by: Trinda Pascal    Exercises  Prone Scapular Slide with Shoulder Extension - 1 x daily - 7 x weekly - 1 sets - 20 reps - 5 hold  Standing Backward Shoulder Shrug with Dumbbells - 1 x daily - 7 x weekly - 3 sets - 15 reps  Sidelying Thoracic Rotation with Open Book - 1 x daily - 7 x weekly - 1 sets - 10 reps  Shoulder Extension with Resistance - 1 x daily - 7 x weekly - 3 sets - 10 reps  Standing Row with Anchored Resistance - 1 x daily - 7 x weekly - 3 sets - 10 reps  Supine Lower Trapezius Strengthening - 1 x daily - 7 x weekly - 1 sets - 20 reps - 5 hold       ---      Flowsheet Row ---   Total Time    Timed Minutes 43 minutes   Total Time 43 minutes              Assessment   Pt challenged with quadruped exercises both with fatigue from weight bearing on arms L>R and with ROM through spine.   Tightness R SCM reduced with STM   Pt will benefit from continued skilled outpatient PT to address impairments and progress towards functional goals.   Plan   Continue POC- FOTO next session   continue STM, cervical mobility, deep neck flexor and scapular stability exercises     Goals      Goal 1: Improve Physical FS Primary Measure from 55 to 66 or better to demonstrate change for significant functional improvement.      Sessions: 16      Goal 2:  Patient will demonstrate independence in prescribed HEP with proper form, sets and reps for safe discharge to an independent program.   Sessions: 16      Goal 3: Patient will demonstrate cervical rotation AROM of greater than or equal to 70 degrees bilaterally to increase visual field to allow patient to check blind spots safely, change lanes safely, and back out of parking spots without pain         Sessions: 16      Goal 4: Patient will demonstrate >15 PSI of flexion and abduction to improve dynamic stability to allow patient to lift groceries from floor without pain         Sessions: 16                                  Estrellita Ludwig, DPT

## 2021-03-13 ENCOUNTER — Inpatient Hospital Stay: Payer: Medicare Other | Admitting: Physical Therapist

## 2021-03-13 DIAGNOSIS — M542 Cervicalgia: Secondary | ICD-10-CM | POA: Insufficient documentation

## 2021-03-18 ENCOUNTER — Inpatient Hospital Stay: Payer: Medicare Other | Admitting: Rehabilitative and Restorative Service Providers"

## 2021-03-18 DIAGNOSIS — M542 Cervicalgia: Secondary | ICD-10-CM

## 2021-03-18 NOTE — PT/OT Therapy Note (Signed)
Name:Brenda Stevens Age: 75 y.o.   Date of Service: 03/18/2021  Referring Physician: Bertram Gala,*   Date of Injury: 01/13/2021  Date Care Plan Established/Reviewed: 02/13/2021  Date Treatment Started: 02/13/2021  End of Certification Date: 05/13/2021  Sessions in Plan of Care: 16  Surgery Date: No data was found  MD Follow-up: No data was found  Medbridge Code: No data was found    Visit Count: 7   Diagnosis:   1. Cervical pain        Subjective     Daily Subjective   Pt reports pain continuing to decrease. Reports "anxiety" with being on hands and knees. Afraid she is going to fall off the bed.     Social Support/Occupation              Occupation: retired    Precautions: No data was found  Allergies: Patient has no allergy information on record.    No past medical history on file.    Initial Evaluation Reference and/or Current Measurements(as dated):    Outcomes:  Goal: 66 Initial  06/09                    Primary Functional Status Measure 55           NDI 28            Pain: /10 /10 /10 /10 /10   Pain last 24 hrs 5            Least pain last 30 days  8           Greatest pain last 30 days  8           PSFS Activities: /10 /10 /10 /10 /10   1. mobility, steps  5           2. posture  5           3. lifting, carrying  5              Posture   IE 02/13/21: forward head, rounded shoulders, protracted scapulae     Palpation   IE 02/13/21: Negative spurling, positive distraction/compression; TTP and tightness SCM, scalenes, LS, UT, thoracic and cervical paraspinals  b/l; Elevated and TTP 1st rib b/l; TTP C4-6, hypomobility throughout cervical and thoracic spine; R shoulder elevated and protracted compared to L      Cervical AROM  Initial   06/09     7/5  7/12       Flexion 30 p            Extension 20 p            Side Bend R 15 p           Side Bend L 5 p  15 deg, nil pain         Rotation R 40 p     45 p       Rotation L 40 p     45        (blank fields were intentionally left blank)     UE Strength  PSI  Initial R  06/09 Initial L  06/09       R    L    R    L    R    L    R    L   Shoulder Flex 5.6 p 7.6 p  Shoulder Abd (C5) 4.6 p 5.4 p                   Shoulder Ext                       Shoulder ER                       Shoulder IR                       Rhomboids                       Serratus                       Upper Trap (C4)                     Mid Trap (C4) sitting   4.6 p 6.0 p                    Lower Trap (C4)                       Biceps (C6)                       Triceps (C7)                       Wrist Ext (C7)                       Wrist Flex (C7)                       (blank fields were intentionally left blank)      Objective                   Treatment     Therapeutic Exercises - Justified to address any of the following:  To develop strength, endurance, ROM and/or flexibility.   Recumbent bike, 6 min, with subjective discussion and plan for today's session    Post assessment of cervical rotation to determine effectiveness of activity    Reviewed quadruped cat camel with pillow prop underneath knee to accommodate knee replacement, 3x8 reps: Min verbal cues for elbow extension and knuckle placement underneath shoulders to reduce deltoid strain    Educated on proper pillow height use relative to cervical lordosis mechanics, differences between rigid versus flexible joint mobility    Not today:         Not today:     Supine R UT stretch, 4x30" with scap depression   Seated middle scalene isometrics, 2x5x5" holds: Min verbal cues to "meet resistance" to allow for autogenic inhibition  - Sidelying thoracic rotation, x15 B  - Reactive isometrics for shoulder ER, RTB, x10 B, verbal cueing for technique  - Hooklying B shoulder abd, RTB, 2x15, verbal cueing for slow controlled motion  - Hook lying LT activation 5sec hold x20    Neuromuscular Re-Education - Justified to address any of the following:   Re-education of movement, balance, coordination, kinesthetic sense, posture and/or  proprioception for sitting and/or standing activities.   Serratus towel press with pillow case at wall, 2x10 reps bilaterally: Min facilitation of posterior tipping of R scapula; mod tactile cues to R Upper  trapezius to limit hiking; min verbal cues for elbow extension and humeral ER for SA engagement    Not today:    Rows and pull down ,GTB, 2x15, tactile cueing for adequate scapular retraction and posterior depression    Progressed: standing B shoulder ER and horizontal abduction, GTB, 2x10, verbal/tactile cueing for mid trap activation     Not today:   Standing A's with red TB, 3x10 reps: Mod verbal cues for elbow extension and supination of BUE for increased post cuff facilitation; focusing on scap stabilizer and RC cuff endurance to reduce forward head posture      Manual Therapy - Justified to address any of the following:    Mobilization of joints and soft tissues, manipulation, manual lymphatic drainage, and/or manual traction.    Supine:    STM to L levator, posterior and middle scalenes, UT  Hold relax techinque for L levator into R flexion quadrant    Not today:   - SOR  - Manual cervical traction, 30 sec x4  - First rib inf mob, grIV, 20 sec x3 B  - PPM cervical rotation and sidebending grade 3 with manual scapular depression    Home Exercises   Access Code: [email or web address]  URL: [email or web address]  Date: 02/25/2021  Prepared by: Trinda Pascal    Exercises  Prone Scapular Slide with Shoulder Extension - 1 x daily - 7 x weekly - 1 sets - 20 reps - 5 hold  Standing Backward Shoulder Shrug with Dumbbells - 1 x daily - 7 x weekly - 3 sets - 15 reps  Sidelying Thoracic Rotation with Open Book - 1 x daily - 7 x weekly - 1 sets - 10 reps  Shoulder Extension with Resistance - 1 x daily - 7 x weekly - 3 sets - 10 reps  Standing Row with Anchored Resistance - 1 x daily - 7 x weekly - 3 sets - 10 reps  Supine Lower Trapezius Strengthening - 1 x daily - 7 x weekly - 1 sets - 20 reps - 5 hold       ---       Flowsheet Row ---   Total Time    Timed Minutes 42 minutes   Total Time 42 minutes              Assessment   Pt presenting with increased levator tone today, managed with hold relax techniques. Pt continuing to gradually improve cervical range of motion with min pain post activity. Serratus towel slides added for reciprocal inhibition of levator with tactile cues required to limit hiking. Cervical mechanics reviewed with pt with pt advised to used medial bodied pillow due to excess rigid cervical lordosis. Quadruped thoracic rotations also reviewed with pt due to pt exhibiting fear avoidance behaviors with pillow modification provided to accommodate R TKR. Pt would continue to benefit from skilled PT to address cervical and upper thoraci mobility deficits, improve periscapular strength, endurance and motor control.  Plan   Continue STM, cervical mobility, deep neck flexor and scapular stability exercises     Goals      Goal 1: Improve Physical FS Primary Measure from 55 to 66 or better to demonstrate change for significant functional improvement.      Sessions: 16      Goal 2: Patient will demonstrate independence in prescribed HEP with proper form, sets and reps for safe discharge to an independent program.   Sessions: 16      Goal 3: Patient will demonstrate  cervical rotation AROM of greater than or equal to 70 degrees bilaterally to increase visual field to allow patient to check blind spots safely, change lanes safely, and back out of parking spots without pain         Sessions: 16      Goal 4: Patient will demonstrate >15 PSI of flexion and abduction to improve dynamic stability to allow patient to lift groceries from floor without pain         Sessions: 16                                  Newton Pigg, DPT

## 2021-03-19 NOTE — PT/OT Therapy Note (Signed)
Name:Brenda Stevens Age: 75 y.o.   Date of Service: 03/20/2021  Referring Physician: Bertram Gala,*   Date of Injury: 01/13/2021  Date Care Plan Established/Reviewed: 02/13/2021  Date Treatment Started: 02/13/2021  End of Certification Date: 05/13/2021  Sessions in Plan of Care: 16  Surgery Date: No data was found  MD Follow-up: No data was found  Medbridge Code: No data was found    Visit Count: 8   Diagnosis:   1. Cervical pain          Subjective     Daily Subjective   Pt reports she is feeling better, didn't sleep well and feels like she needs a new pillow.     Social Support/Occupation              Occupation: retired    Precautions: No data was found  Allergies: Patient has no allergy information on record.    No past medical history on file.    Initial Evaluation Reference and/or Current Measurements(as dated):    Outcomes:  Goal: 66 Initial  06/09                    Primary Functional Status Measure 55           NDI 28            Pain: /10 /10 /10 /10 /10   Pain last 24 hrs 5            Least pain last 30 days  8           Greatest pain last 30 days  8           PSFS Activities: /10 /10 /10 /10 /10   1. mobility, steps  5           2. posture  5           3. lifting, carrying  5              Posture   IE 02/13/21: forward head, rounded shoulders, protracted scapulae     Palpation   IE 02/13/21: Negative spurling, positive distraction/compression; TTP and tightness SCM, scalenes, LS, UT, thoracic and cervical paraspinals  b/l; Elevated and TTP 1st rib b/l; TTP C4-6, hypomobility throughout cervical and thoracic spine; R shoulder elevated and protracted compared to L      Cervical AROM  Initial   06/09     7/5  7/12       Flexion 30 p            Extension 20 p            Side Bend R 15 p           Side Bend L 5 p  15 deg, nil pain         Rotation R 40 p     45 p       Rotation L 40 p     45        (blank fields were intentionally left blank)     UE Strength  PSI Initial R  06/09 Initial L  06/09        R  7/14    L  7/14    R    L    R    L    R    L   Shoulder Flex 5.6 p 7.6 p 6.3   7.3  Shoulder Abd (C5) 4.6 p 5.4 p 5.7   4.8               Shoulder Ext                       Shoulder ER                       Shoulder IR                       Rhomboids                       Serratus                       Upper Trap (C4)                     Mid Trap (C4) sitting   4.6 p 6.0 p   7.2  9.3               Lower Trap (C4)                       Biceps (C6)                       Triceps (C7)                       Wrist Ext (C7)                       Wrist Flex (C7)                       (blank fields were intentionally left blank)      Objective                   Treatment     Therapeutic Exercises - Justified to address any of the following:  To develop strength, endurance, ROM and/or flexibility.   UBE L5 X 6 min (3 min fwd/3 min backwards) w/subjective discussion to guide treatment session    Assessed strength, see above, discussion of current and previous measures and progress towards goals    Added:   Push up on table 2 x 10, VC for keeping shoulders away from ears  Standing flexion and abduction b/l #2 3 x 10 each, VC for preventing UT compensation     Continued:  - standing B shoulder hori abd and b/l ER, RTB, 2x15, verbal cueing for slow controlled motion    Not today:     quadruped cat camel with pillow prop underneath knee to accommodate knee replacement, 3x8 reps: Min verbal cues for elbow extension and knuckle placement underneath shoulders to reduce deltoid strain  Supine R UT stretch, 4x30" with scap depression   Seated middle scalene isometrics, 2x5x5" holds: Min verbal cues to "meet resistance" to allow for autogenic inhibition  - Sidelying thoracic rotation, x15 B  - Reactive isometrics for shoulder ER, RTB, x10 B, verbal cueing for technique    - Hook lying LT activation 5sec hold x20    Neuromuscular Re-Education - Justified to address any of the following:   Re-education of movement,  balance, coordination, kinesthetic sense, posture and/or proprioception for sitting and/or standing activities.   Not today:  Serratus  towel press with pillow case at wall, 2x10 reps bilaterally: Min facilitation of posterior tipping of R scapula; mod tactile cues to R Upper trapezius to limit hiking; min verbal cues for elbow extension and humeral ER for SA engagement  Rows and pull down ,GTB, 2x15, tactile cueing for adequate scapular retraction and posterior depression  standing B shoulder ER and horizontal abduction, GTB, 2x10, verbal/tactile cueing for mid trap activation   Standing A's with red TB, 3x10 reps: Mod verbal cues for elbow extension and supination of BUE for increased post cuff facilitation; focusing on scap stabilizer and RC cuff endurance to reduce forward head posture      Manual Therapy - Justified to address any of the following:    Mobilization of joints and soft tissues, manipulation, manual lymphatic drainage, and/or manual traction.    Supine:    STM to b/l levator, posterior and middle scalenes, UT  - PPM cervical rotation and sidebending grade 3 with manual scapular depression  - SOR with Manual cervical traction grade 2-3    Not today:   - First rib inf mob, grIV, 20 sec x3 B      Home Exercises   Access Code: [email or web address]  URL: [email or web address]  Date: 02/25/2021  Prepared by: Trinda Pascal    Exercises  Prone Scapular Slide with Shoulder Extension - 1 x daily - 7 x weekly - 1 sets - 20 reps - 5 hold  Standing Backward Shoulder Shrug with Dumbbells - 1 x daily - 7 x weekly - 3 sets - 15 reps  Sidelying Thoracic Rotation with Open Book - 1 x daily - 7 x weekly - 1 sets - 10 reps  Shoulder Extension with Resistance - 1 x daily - 7 x weekly - 3 sets - 10 reps  Standing Row with Anchored Resistance - 1 x daily - 7 x weekly - 3 sets - 10 reps  Supine Lower Trapezius Strengthening - 1 x daily - 7 x weekly - 1 sets - 20 reps - 5 hold       ---      Flowsheet Row ---   Total Time     Timed Minutes 39 minutes   Total Time 39 minutes                Assessment   Pt instructed to perform quadruped exercises leaning on table instead due to fear of falling  Noted greater UT compensation on L with standing exercises, pt required VC/TC for scapular retraction and depression to reduce  Strength improved on L side, same on R, slow progression.     Pt would continue to benefit from skilled PT to address cervical and upper thoracic mobility deficits, improve periscapular strength, endurance and motor control.  Plan   Continue STM, cervical mobility, deep neck flexor and scapular stability exercises   Update FOTO next session   Shift focus to strengthening     Goals      Goal 1: Improve Physical FS Primary Measure from 55 to 66 or better to demonstrate change for significant functional improvement.      Sessions: 16      Goal 2: Patient will demonstrate independence in prescribed HEP with proper form, sets and reps for safe discharge to an independent program.   Sessions: 16      Goal 3: Patient will demonstrate cervical rotation AROM of greater than or equal to 70 degrees bilaterally to increase visual field to allow patient to check blind  spots safely, change lanes safely, and back out of parking spots without pain         Sessions: 16      Goal 4: Patient will demonstrate >15 PSI of flexion and abduction to improve dynamic stability to allow patient to lift groceries from floor without pain         Sessions: 16                                    Estrellita Ludwig, DPT

## 2021-03-20 ENCOUNTER — Inpatient Hospital Stay: Payer: Medicare Other | Admitting: Physical Therapist

## 2021-03-20 DIAGNOSIS — M542 Cervicalgia: Secondary | ICD-10-CM

## 2021-03-24 NOTE — PT/OT Therapy Note (Signed)
Name:Brenda Stevens Age: 75 y.o.   Date of Service: 03/25/2021  Referring Physician: Bertram Gala,*   Date of Injury: 01/13/2021  Date Care Plan Established/Reviewed: 02/13/2021  Date Treatment Started: 02/13/2021  End of Certification Date: 05/13/2021  Sessions in Plan of Care: 16  Surgery Date: No data was found  MD Follow-up: No data was found  Medbridge Code: No data was found    Visit Count: 9   Diagnosis:   1. Cervical pain          Subjective     Daily Subjective   Pt reports she is feeling better.     Social Support/Occupation              Occupation: retired    Precautions: No data was found  Allergies: Patient has no allergy information on record.    No past medical history on file.    Initial Evaluation Reference and/or Current Measurements(as dated):    Outcomes:  Goal: 66 Initial  06/09        7/19            Primary Functional Status Measure 55  85         NDI 28            Pain: /10 /10 /10 /10 /10   Pain last 24 hrs 5            Least pain last 30 days  8           Greatest pain last 30 days  8           PSFS Activities: /10 /10 /10 /10 /10   1. mobility, steps  5           2. posture  5           3. lifting, carrying  5              Posture   IE 02/13/21: forward head, rounded shoulders, protracted scapulae     Palpation   IE 02/13/21: Negative spurling, positive distraction/compression; TTP and tightness SCM, scalenes, LS, UT, thoracic and cervical paraspinals  b/l; Elevated and TTP 1st rib b/l; TTP C4-6, hypomobility throughout cervical and thoracic spine; R shoulder elevated and protracted compared to L      Cervical AROM  Initial   06/09     7/5  7/12       Flexion 30 p            Extension 20 p            Side Bend R 15 p           Side Bend L 5 p  15 deg, nil pain         Rotation R 40 p     45 p       Rotation L 40 p     45        (blank fields were intentionally left blank)     UE Strength  PSI Initial R  06/09 Initial L  06/09       R  7/14    L  7/14    R    L    R    L    R    L    Shoulder Flex 5.6 p 7.6 p 6.3   7.3               Shoulder  Abd (C5) 4.6 p 5.4 p 5.7   4.8               Shoulder Ext                       Shoulder ER                       Shoulder IR                       Rhomboids                       Serratus                       Upper Trap (C4)                     Mid Trap (C4) sitting   4.6 p 6.0 p   7.2  9.3               Lower Trap (C4)                       Biceps (C6)                       Triceps (C7)                       Wrist Ext (C7)                       Wrist Flex (C7)                       (blank fields were intentionally left blank)      Objective                   Treatment     Therapeutic Exercises - Justified to address any of the following:  To develop strength, endurance, ROM and/or flexibility.   UBE L5 X 6 min (3 min fwd/3 min backwards) w/subjective discussion to guide treatment session    Assessed FOTO, see above, discussion of current and previous measures and progress towards goals    Push up on table 2 x 10, VC for keeping shoulders away from ears  Standing flexion and abduction b/l #2 3 x 10 each, VC for preventing UT compensation   - standing B shoulder hori abd and b/l ER, RTB, 2x10 verbal cueing for slow controlled motion    Added:  Standing OH Press #2 b/l 3 x 10, VC for neutral head position     Not today:     quadruped cat camel with pillow prop underneath knee to accommodate knee replacement, 3x8 reps: Min verbal cues for elbow extension and knuckle placement underneath shoulders to reduce deltoid strain  Supine R UT stretch, 4x30" with scap depression   Seated middle scalene isometrics, 2x5x5" holds: Min verbal cues to "meet resistance" to allow for autogenic inhibition  - Sidelying thoracic rotation, x15 B  - Reactive isometrics for shoulder ER, RTB, x10 B, verbal cueing for technique    - Hook lying LT activation 5sec hold x20    Neuromuscular Re-Education - Justified to address any of the following:   Re-education of movement, balance,  coordination, kinesthetic sense, posture and/or proprioception  for sitting and/or standing activities.   Added:  Standing front raise and circles with yellow TB around wrist 3 x 10 each, VC for maintaining tension in band for scapular stability with OH movements    Not today:  Serratus towel press with pillow case at wall, 2x10 reps bilaterally: Min facilitation of posterior tipping of R scapula; mod tactile cues to R Upper trapezius to limit hiking; min verbal cues for elbow extension and humeral ER for SA engagement  Rows and pull down ,GTB, 2x15, tactile cueing for adequate scapular retraction and posterior depression  standing B shoulder ER and horizontal abduction, GTB, 2x10, verbal/tactile cueing for mid trap activation   Standing A's with red TB, 3x10 reps: Mod verbal cues for elbow extension and supination of BUE for increased post cuff facilitation; focusing on scap stabilizer and RC cuff endurance to reduce forward head posture      Manual Therapy - Justified to address any of the following:    Mobilization of joints and soft tissues, manipulation, manual lymphatic drainage, and/or manual traction.    Supine:    STM to b/l levator, posterior and middle scalenes, UT  - PPM cervical rotation and sidebending grade 3 with manual scapular depression b/l  - SOR with Manual cervical traction grade 2-3        Home Exercises   Access Code: [email or web address]  URL: [email or web address]  Date: 02/25/2021  Prepared by: Trinda Pascal    Exercises  Prone Scapular Slide with Shoulder Extension - 1 x daily - 7 x weekly - 1 sets - 20 reps - 5 hold  Standing Backward Shoulder Shrug with Dumbbells - 1 x daily - 7 x weekly - 3 sets - 15 reps  Sidelying Thoracic Rotation with Open Book - 1 x daily - 7 x weekly - 1 sets - 10 reps  Shoulder Extension with Resistance - 1 x daily - 7 x weekly - 3 sets - 10 reps  Standing Row with Anchored Resistance - 1 x daily - 7 x weekly - 3 sets - 10 reps  Supine Lower Trapezius Strengthening  - 1 x daily - 7 x weekly - 1 sets - 20 reps - 5 hold       ---      Flowsheet Row ---   Total Time    Timed Minutes 42 minutes   Total Time 42 minutes                Assessment   FOTO score met.  Pt demonstrated good ability to self correct UT compensation with exercises today.     Pt would continue to benefit from skilled PT to address cervical and upper thoracic mobility deficits, improve periscapular strength, endurance and motor control.  Plan   Continue STM, cervical mobility, deep neck flexor and scapular stability exercises   Shift focus to strengthening   Update HEP next session     Goals      Goal 1: Improve Physical FS Primary Measure from 55 to 66 or better to demonstrate change for significant functional improvement.     7/19 SG met      Sessions: 16      Goal 2: Patient will demonstrate independence in prescribed HEP with proper form, sets and reps for safe discharge to an independent program.    7/19 SG pt compliant    Sessions: 16      Goal 3: Patient will demonstrate cervical rotation AROM of greater than or equal to  70 degrees bilaterally to increase visual field to allow patient to check blind spots safely, change lanes safely, and back out of parking spots without pain    7/12 progressing          Sessions: 16      Goal 4: Patient will demonstrate >15 PSI of flexion and abduction to improve dynamic stability to allow patient to lift groceries from floor without pain    7/14 minimal change          Sessions: 16                                    Estrellita Ludwig, DPT

## 2021-03-25 ENCOUNTER — Inpatient Hospital Stay: Payer: Medicare Other | Admitting: Physical Therapist

## 2021-03-25 DIAGNOSIS — M542 Cervicalgia: Secondary | ICD-10-CM

## 2021-03-26 NOTE — PT/OT Therapy Note (Signed)
Name:Brenda Stevens Age: 75 y.o.   Date of Service: 03/27/2021  Referring Physician: Bertram Gala,*   Date of Injury: 01/13/2021  Date Care Plan Established/Reviewed: 02/13/2021  Date Treatment Started: 02/13/2021  End of Certification Date: 05/13/2021  Sessions in Plan of Care: 16  Surgery Date: No data was found  MD Follow-up: No data was found  Medbridge Code: No data was found    Visit Count: 10   Diagnosis:   1. Cervical pain          Subjective     Daily Subjective   Pt reports she was able to swim laps yesterday without issue.     Social Support/Occupation              Occupation: retired    Precautions: No data was found  Allergies: Patient has no allergy information on record.    No past medical history on file.    Initial Evaluation Reference and/or Current Measurements(as dated):    Outcomes:  Goal: 66 Initial  06/09        7/19            Primary Functional Status Measure 55  85         NDI 28            Pain: /10 /10 /10 /10 /10   Pain last 24 hrs 5            Least pain last 30 days  8           Greatest pain last 30 days  8           PSFS Activities: /10 /10 /10 /10 /10   1. mobility, steps  5           2. posture  5           3. lifting, carrying  5              Posture   IE 02/13/21: forward head, rounded shoulders, protracted scapulae     Palpation   IE 02/13/21: Negative spurling, positive distraction/compression; TTP and tightness SCM, scalenes, LS, UT, thoracic and cervical paraspinals  b/l; Elevated and TTP 1st rib b/l; TTP C4-6, hypomobility throughout cervical and thoracic spine; R shoulder elevated and protracted compared to L      Cervical AROM  Initial   06/09     7/5  7/12       Flexion 30 p            Extension 20 p            Side Bend R 15 p           Side Bend L 5 p  15 deg, nil pain         Rotation R 40 p     45 p       Rotation L 40 p     45        (blank fields were intentionally left blank)     UE Strength  PSI Initial R  06/09 Initial L  06/09       R  7/14    L  7/14    R     L    R    L    R    L   Shoulder Flex 5.6 p 7.6 p 6.3   7.3  Shoulder Abd (C5) 4.6 p 5.4 p 5.7   4.8               Shoulder Ext                       Shoulder ER                       Shoulder IR                       Rhomboids                       Serratus                       Upper Trap (C4)                     Mid Trap (C4) sitting   4.6 p 6.0 p   7.2  9.3               Lower Trap (C4)                       Biceps (C6)                       Triceps (C7)                       Wrist Ext (C7)                       Wrist Flex (C7)                       (blank fields were intentionally left blank)      Objective                   Treatment     Therapeutic Exercises - Justified to address any of the following:  To develop strength, endurance, ROM and/or flexibility.   UBE L5 X 6 min (3 min fwd/3 min backwards) w/subjective discussion to guide treatment session    Performed progressed HEP:    Shoulder Extension with Resistance - 1 x daily - 7 x weekly - 3 sets - 10 reps  Standing Row with Anchored Resistance - 1 x daily - 7 x weekly - 3 sets - 10 reps  Push Up on Table - 1 x daily - 7 x weekly - 3 sets - 10 reps  Standing Shoulder Flexion to 90 Degrees with Dumbbells - 1 x daily - 7 x weekly - 3 sets - 10 reps  Shoulder Abduction with Dumbbells - Thumbs Up - 1 x daily - 7 x weekly - 3 sets - 10 reps  Shoulder Overhead Press in Flexion with Dumbbells - 1 x daily - 7 x weekly - 3 sets - 10 reps  Shoulder Flexion Serratus Activation with Resistance - 1 x daily - 7 x weekly - 3 sets - 10 reps    Not today:   Push up on table 2 x 10, VC for keeping shoulders away from ears  Standing flexion and abduction b/l #2 3 x 10 each, VC for preventing UT compensation   - standing B shoulder hori abd and b/l ER, RTB, 2x10 verbal cueing for slow controlled motion  Standing OH Press #2 b/l 3 x 10, VC for neutral head position   quadruped cat camel with pillow prop underneath knee to accommodate knee replacement, 3x8  reps: Min verbal cues for elbow extension and knuckle placement underneath shoulders to reduce deltoid strain  Supine R UT stretch, 4x30" with scap depression   Seated middle scalene isometrics, 2x5x5" holds: Min verbal cues to "meet resistance" to allow for autogenic inhibition  - Sidelying thoracic rotation, x15 B  - Reactive isometrics for shoulder ER, RTB, x10 B, verbal cueing for technique  - Hook lying LT activation 5sec hold x20    Neuromuscular Re-Education - Justified to address any of the following:   Re-education of movement, balance, coordination, kinesthetic sense, posture and/or proprioception for sitting and/or standing activities.   Not today:  Standing front raise and circles with yellow TB around wrist 3 x 10 each, VC for maintaining tension in band for scapular stability with OH movements  Serratus towel press with pillow case at wall, 2x10 reps bilaterally: Min facilitation of posterior tipping of R scapula; mod tactile cues to R Upper trapezius to limit hiking; min verbal cues for elbow extension and humeral ER for SA engagement  Rows and pull down ,GTB, 2x15, tactile cueing for adequate scapular retraction and posterior depression  standing B shoulder ER and horizontal abduction, GTB, 2x10, verbal/tactile cueing for mid trap activation   Standing A's with red TB, 3x10 reps: Mod verbal cues for elbow extension and supination of BUE for increased post cuff facilitation; focusing on scap stabilizer and RC cuff endurance to reduce forward head posture      Manual Therapy - Justified to address any of the following:    Mobilization of joints and soft tissues, manipulation, manual lymphatic drainage, and/or manual traction.    Supine:    STM to b/l levator, posterior and middle scalenes, UT  - PPM cervical rotation and sidebending grade 3 with manual scapular depression b/l  - SOR with Manual cervical traction grade 2-3        Home Exercises   Access Code: [email or web address]  URL:  [email or web address]  Date: 03/27/2021  Prepared by: Estrellita Ludwig    Exercises  Shoulder Extension with Resistance - 1 x daily - 7 x weekly - 3 sets - 10 reps  Standing Row with Anchored Resistance - 1 x daily - 7 x weekly - 3 sets - 10 reps  Push Up on Table - 1 x daily - 7 x weekly - 3 sets - 10 reps  Standing Shoulder Flexion to 90 Degrees with Dumbbells - 1 x daily - 7 x weekly - 3 sets - 10 reps  Shoulder Abduction with Dumbbells - Thumbs Up - 1 x daily - 7 x weekly - 3 sets - 10 reps  Shoulder Overhead Press in Flexion with Dumbbells - 1 x daily - 7 x weekly - 3 sets - 10 reps  Shoulder Flexion Serratus Activation with Resistance - 1 x daily - 7 x weekly - 3 sets - 10 reps       ---      Flowsheet Row ---   Total Time    Timed Minutes 39 minutes   Total Time 39 minutes                  Assessment   Patient has been seen for 10 visits. Patient has made good progress in self reported outcomes, pain, function, ROM since initial evaluation. Patient is still demonstrating impairment  in shoulder/scapular strength and stability, functional activities including lifting and reaching overhead. Patient requires continued skilled care to address impairments and reach goals.    Session focused on updating HEP with progression of strengthening exercises    Pt would continue to benefit from skilled PT to address cervical and upper thoracic mobility deficits, improve periscapular strength, endurance and motor control.      Plan   Continue STM, cervical mobility, deep neck flexor and scapular stability exercises   Shift focus to strengthening       Goals      Goal 1: Improve Physical FS Primary Measure from 55 to 66 or better to demonstrate change for significant functional improvement.     7/19 SG met      Sessions: 16      Goal 2: Patient will demonstrate independence in prescribed HEP with proper form, sets and reps for safe discharge to an independent program.    7/19 SG pt compliant    Sessions: 16       Goal 3: Patient will demonstrate cervical rotation AROM of greater than or equal to 70 degrees bilaterally to increase visual field to allow patient to check blind spots safely, change lanes safely, and back out of parking spots without pain    7/12 progressing          Sessions: 16      Goal 4: Patient will demonstrate >15 PSI of flexion and abduction to improve dynamic stability to allow patient to lift groceries from floor without pain    7/14 minimal change          Sessions: 16                                    Estrellita Ludwig, DPT

## 2021-03-27 ENCOUNTER — Inpatient Hospital Stay: Payer: Medicare Other | Admitting: Physical Therapist

## 2021-03-27 DIAGNOSIS — M542 Cervicalgia: Secondary | ICD-10-CM

## 2021-03-27 NOTE — Progress Notes (Signed)
Name:Priscella Kimble Age: 75 y.o.   Date of Service: 03/27/2021  Referring Physician: Bertram Gala,*   Date of Injury: 01/13/2021  Date Care Plan Established/Reviewed: 02/13/2021  Date Treatment Started: 02/13/2021  End of Certification Date: 05/13/2021  Sessions in Plan of Care: 16  Surgery Date: No data was found  MD Follow-up: No data was found  Medbridge Code: No data was found    Visit Count: 10   Diagnosis:   1. Cervical pain             Precautions: No data was found  Allergies: Patient has no allergy information on record.    No past medical history on file.                     ---      Flowsheet Row ---   Total Time    Timed Minutes 39 minutes   Total Time 39 minutes                  Subjective     Daily Subjective   Pt reports she was able to swim laps yesterday without issue.     Social Support/Occupation              Occupation: retired    Precautions: No data was found  Allergies: Patient has no allergy information on record.    No past medical history on file.    Initial Evaluation Reference and/or Current Measurements(as dated):    Outcomes:  Goal: 66 Initial  06/09        7/19            Primary Functional Status Measure 55  85         NDI 28            Pain: /10 /10 /10 /10 /10   Pain last 24 hrs 5            Least pain last 30 days  8           Greatest pain last 30 days  8           PSFS Activities: /10 /10 /10 /10 /10   1. mobility, steps  5           2. posture  5           3. lifting, carrying  5              Posture   IE 02/13/21: forward head, rounded shoulders, protracted scapulae     Palpation   IE 02/13/21: Negative spurling, positive distraction/compression; TTP and tightness SCM, scalenes, LS, UT, thoracic and cervical paraspinals  b/l; Elevated and TTP 1st rib b/l; TTP C4-6, hypomobility throughout cervical and thoracic spine; R shoulder elevated and protracted compared to L      Cervical AROM  Initial   06/09     7/5  7/12       Flexion 30 p            Extension 20 p            Side  Bend R 15 p           Side Bend L 5 p  15 deg, nil pain         Rotation R 40 p     45 p       Rotation L 40 p     45        (  blank fields were intentionally left blank)     UE Strength  PSI Initial R  06/09 Initial L  06/09       R  7/14    L  7/14    R    L    R    L    R    L   Shoulder Flex 5.6 p 7.6 p 6.3   7.3               Shoulder Abd (C5) 4.6 p 5.4 p 5.7   4.8               Shoulder Ext                       Shoulder ER                       Shoulder IR                       Rhomboids                       Serratus                       Upper Trap (C4)                     Mid Trap (C4) sitting   4.6 p 6.0 p   7.2  9.3               Lower Trap (C4)                       Biceps (C6)                       Triceps (C7)                       Wrist Ext (C7)                       Wrist Flex (C7)                       (blank fields were intentionally left blank)          Assessment   Patient has been seen for 10 visits. Patient has made good progress in self reported outcomes, pain, function, ROM since initial evaluation. Patient is still demonstrating impairment in shoulder/scapular strength and stability, functional activities including lifting and reaching overhead. Patient requires continued skilled care to address impairments and reach goals.    Session focused on updating HEP with progression of strengthening exercises    Pt would continue to benefit from skilled PT to address cervical and upper thoracic mobility deficits, improve periscapular strength, endurance and motor control.      Plan   Continue STM, cervical mobility, deep neck flexor and scapular stability exercises   Shift focus to strengthening                Goals      Goal 1: Improve Physical FS Primary Measure from 55 to 66 or better to demonstrate change for significant functional improvement.     7/19 SG met      Sessions: 16      Goal 2: Patient  will demonstrate independence in prescribed HEP with proper form, sets and reps for safe discharge  to an independent program.    7/19 SG pt compliant    Sessions: 16      Goal 3: Patient will demonstrate cervical rotation AROM of greater than or equal to 70 degrees bilaterally to increase visual field to allow patient to check blind spots safely, change lanes safely, and back out of parking spots without pain    7/12 progressing          Sessions: 16      Goal 4: Patient will demonstrate >15 PSI of flexion and abduction to improve dynamic stability to allow patient to lift groceries from floor without pain    7/14 minimal change          Sessions: 16                                  Estrellita Ludwig, DPT

## 2021-03-31 NOTE — PT/OT Therapy Note (Signed)
Name:Brenda Stevens Age: 75 y.o.   Date of Service: 04/01/2021  Referring Physician: Bertram Gala,*   Date of Injury: 01/13/2021  Date Care Plan Established/Reviewed: 02/13/2021  Date Treatment Started: 02/13/2021  End of Certification Date: 05/13/2021  Sessions in Plan of Care: 16  Surgery Date: No data was found  MD Follow-up: No data was found  Medbridge Code: No data was found    Visit Count: 11   Diagnosis:   1. Cervical pain          Subjective     Daily Subjective   Pt reports she swam laps again and thinks she might have over done it.     Social Support/Occupation              Occupation: retired    Precautions: No data was found  Allergies: Patient has no allergy information on record.    No past medical history on file.    Initial Evaluation Reference and/or Current Measurements(as dated):    Outcomes:  Goal: 66 Initial  06/09        7/19            Primary Functional Status Measure 55  85         NDI 28            Pain: /10 /10 /10 /10 /10   Pain last 24 hrs 5            Least pain last 30 days  8           Greatest pain last 30 days  8           PSFS Activities: /10 /10 /10 /10 /10   1. mobility, steps  5           2. posture  5           3. lifting, carrying  5              Posture   IE 02/13/21: forward head, rounded shoulders, protracted scapulae     Palpation   IE 02/13/21: Negative spurling, positive distraction/compression; TTP and tightness SCM, scalenes, LS, UT, thoracic and cervical paraspinals  b/l; Elevated and TTP 1st rib b/l; TTP C4-6, hypomobility throughout cervical and thoracic spine; R shoulder elevated and protracted compared to L      Cervical AROM  Initial   06/09     7/5  7/12       Flexion 30 p            Extension 20 p            Side Bend R 15 p           Side Bend L 5 p  15 deg, nil pain         Rotation R 40 p     45 p       Rotation L 40 p     45        (blank fields were intentionally left blank)     UE Strength  PSI Initial R  06/09 Initial L  06/09       R  7/14     L  7/14    R    L    R    L    R    L   Shoulder Flex 5.6 p 7.6 p 6.3   7.3  Shoulder Abd (C5) 4.6 p 5.4 p 5.7   4.8               Shoulder Ext                       Shoulder ER                       Shoulder IR                       Rhomboids                       Serratus                       Upper Trap (C4)                     Mid Trap (C4) sitting   4.6 p 6.0 p   7.2  9.3               Lower Trap (C4)                       Biceps (C6)                       Triceps (C7)                       Wrist Ext (C7)                       Wrist Flex (C7)                       (blank fields were intentionally left blank)      Objective                   Treatment     Therapeutic Exercises - Justified to address any of the following:  To develop strength, endurance, ROM and/or flexibility.   UBE L5 X 6 min (3 min fwd/3 min backwards) w/subjective discussion to guide treatment session    Added: standing FM row #15 2 x 10 and pull down #10 3 x 10, lat pull sitting #17.5 3 x 10, VC for form     Not today:   Push up on table 2 x 10, VC for keeping shoulders away from ears  Standing flexion and abduction b/l #2 3 x 10 each, VC for preventing UT compensation   - standing B shoulder hori abd and b/l ER, RTB, 2x10 verbal cueing for slow controlled motion  Standing OH Press #2 b/l 3 x 10, VC for neutral head position   quadruped cat camel with pillow prop underneath knee to accommodate knee replacement, 3x8 reps: Min verbal cues for elbow extension and knuckle placement underneath shoulders to reduce deltoid strain  Supine R UT stretch, 4x30" with scap depression   Seated middle scalene isometrics, 2x5x5" holds: Min verbal cues to "meet resistance" to allow for autogenic inhibition  - Sidelying thoracic rotation, x15 B  - Reactive isometrics for shoulder ER, RTB, x10 B, verbal cueing for technique  - Hook lying LT activation 5sec hold x20    Neuromuscular Re-Education - Justified to address any of the following:    Re-education of movement, balance, coordination, kinesthetic sense,  posture and/or proprioception for sitting and/or standing activities.   Added:  Tandem walking forward and walking backwards 2 x 30 ft each, VC for preventing UT compensation/guard position, for improved balance and decreased guarding in neck area with balance.     Not today:  Standing front raise and circles with yellow TB around wrist 3 x 10 each, VC for maintaining tension in band for scapular stability with OH movements  Serratus towel press with pillow case at wall, 2x10 reps bilaterally: Min facilitation of posterior tipping of R scapula; mod tactile cues to R Upper trapezius to limit hiking; min verbal cues for elbow extension and humeral ER for SA engagement  Rows and pull down ,GTB, 2x15, tactile cueing for adequate scapular retraction and posterior depression  standing B shoulder ER and horizontal abduction, GTB, 2x10, verbal/tactile cueing for mid trap activation   Standing A's with red TB, 3x10 reps: Mod verbal cues for elbow extension and supination of BUE for increased post cuff facilitation; focusing on scap stabilizer and RC cuff endurance to reduce forward head posture      Manual Therapy - Justified to address any of the following:    Mobilization of joints and soft tissues, manipulation, manual lymphatic drainage, and/or manual traction.    Supine:    STM to b/l levator, posterior and middle scalenes, UT  - PPM cervical rotation and sidebending grade 3 with manual scapular depression b/l  - SOR with Manual cervical traction grade 2-3  First rib mob R grade 2-3         Home Exercises   Access Code: [email or web address]  URL: [email or web address]  Date: 03/27/2021  Prepared by: Estrellita Ludwig    Exercises  Shoulder Extension with Resistance - 1 x daily - 7 x weekly - 3 sets - 10 reps  Standing Row with Anchored Resistance - 1 x daily - 7 x weekly - 3 sets - 10 reps  Push Up on Table - 1 x daily - 7 x weekly - 3 sets - 10  reps  Standing Shoulder Flexion to 90 Degrees with Dumbbells - 1 x daily - 7 x weekly - 3 sets - 10 reps  Shoulder Abduction with Dumbbells - Thumbs Up - 1 x daily - 7 x weekly - 3 sets - 10 reps  Shoulder Overhead Press in Flexion with Dumbbells - 1 x daily - 7 x weekly - 3 sets - 10 reps  Shoulder Flexion Serratus Activation with Resistance - 1 x daily - 7 x weekly - 3 sets - 10 reps       ---      Flowsheet Row ---   Total Time    Timed Minutes 43 minutes   Total Time 43 minutes                    Assessment   Education regarding breathing with head turns to both sides when swimming, and slowly increasing frequency and duration of swimming  R first rib elevated, improved post MT  Tandem walking and backwards walking performed with focus on preventing UT tightness in guard position     Pt would continue to benefit from skilled PT to address cervical and upper thoracic mobility deficits, improve periscapular strength, endurance and motor control.      Plan   Continue STM, cervical mobility, deep neck flexor and scapular stability exercises   Shift focus to strengthening       Goals      Goal  1: Improve Physical FS Primary Measure from 55 to 66 or better to demonstrate change for significant functional improvement.     7/19 SG met      Sessions: 16      Goal 2: Patient will demonstrate independence in prescribed HEP with proper form, sets and reps for safe discharge to an independent program.    7/19 SG pt compliant    Sessions: 16      Goal 3: Patient will demonstrate cervical rotation AROM of greater than or equal to 70 degrees bilaterally to increase visual field to allow patient to check blind spots safely, change lanes safely, and back out of parking spots without pain    7/12 progressing          Sessions: 16      Goal 4: Patient will demonstrate >15 PSI of flexion and abduction to improve dynamic stability to allow patient to lift groceries from floor without pain    7/14 minimal change          Sessions: 16                                     Estrellita Ludwig, DPT

## 2021-04-01 ENCOUNTER — Inpatient Hospital Stay: Payer: Medicare Other | Admitting: Physical Therapist

## 2021-04-01 DIAGNOSIS — M542 Cervicalgia: Secondary | ICD-10-CM

## 2021-04-02 NOTE — PT/OT Therapy Note (Deleted)
Name:Brenda Stevens Age: 75 y.o.   Date of Service: 04/03/2021  Referring Physician: Bertram Gala,*   Date of Injury: No data was found  Date Care Plan Established/Reviewed: No data was found  Date Treatment Started: No data was found  End of Certification Date: No data was found  Sessions in Plan of Care: No data was found  Surgery Date: No data was found  MD Follow-up: No data was found  Medbridge Code: No data was found    Visit Count: Visit count could not be calculated. Make sure you are using a visit which is associated with an episode.   Diagnosis:   No diagnosis found.        Subjective     Daily Subjective   Pt reports she swam laps again and thinks she might have over done it.     Social Support/Occupation              Occupation: retired    Precautions: No data was found  Allergies: Patient has no allergy information on record.    No past medical history on file.    Initial Evaluation Reference and/or Current Measurements(as dated):    Outcomes:  Goal: 66 Initial  06/09        7/19            Primary Functional Status Measure 55  85         NDI 28            Pain: /10 /10 /10 /10 /10   Pain last 24 hrs 5            Least pain last 30 days  8           Greatest pain last 30 days  8           PSFS Activities: /10 /10 /10 /10 /10   1. mobility, steps  5           2. posture  5           3. lifting, carrying  5              Posture   IE 02/13/21: forward head, rounded shoulders, protracted scapulae     Palpation   IE 02/13/21: Negative spurling, positive distraction/compression; TTP and tightness SCM, scalenes, LS, UT, thoracic and cervical paraspinals  b/l; Elevated and TTP 1st rib b/l; TTP C4-6, hypomobility throughout cervical and thoracic spine; R shoulder elevated and protracted compared to L      Cervical AROM  Initial   06/09     7/5  7/12       Flexion 30 p            Extension 20 p            Side Bend R 15 p           Side Bend L 5 p  15 deg, nil pain         Rotation R 40 p     45 p        Rotation L 40 p     45        (blank fields were intentionally left blank)     UE Strength  PSI Initial R  06/09 Initial L  06/09       R  7/14    L  7/14    R    L    R  L    R    L   Shoulder Flex 5.6 p 7.6 p 6.3   7.3               Shoulder Abd (C5) 4.6 p 5.4 p 5.7   4.8               Shoulder Ext                       Shoulder ER                       Shoulder IR                       Rhomboids                       Serratus                       Upper Trap (C4)                     Mid Trap (C4) sitting   4.6 p 6.0 p   7.2  9.3               Lower Trap (C4)                       Biceps (C6)                       Triceps (C7)                       Wrist Ext (C7)                       Wrist Flex (C7)                       (blank fields were intentionally left blank)      Objective                   Treatment     Therapeutic Exercises - Justified to address any of the following:  To develop strength, endurance, ROM and/or flexibility.   UBE L5 X 6 min (3 min fwd/3 min backwards) w/subjective discussion to guide treatment session    Added: standing FM row #15 2 x 10 and pull down #10 3 x 10, lat pull sitting #17.5 3 x 10, VC for form     Not today:   Push up on table 2 x 10, VC for keeping shoulders away from ears  Standing flexion and abduction b/l #2 3 x 10 each, VC for preventing UT compensation   - standing B shoulder hori abd and b/l ER, RTB, 2x10 verbal cueing for slow controlled motion  Standing OH Press #2 b/l 3 x 10, VC for neutral head position   quadruped cat camel with pillow prop underneath knee to accommodate knee replacement, 3x8 reps: Min verbal cues for elbow extension and knuckle placement underneath shoulders to reduce deltoid strain  Supine R UT stretch, 4x30" with scap depression   Seated middle scalene isometrics, 2x5x5" holds: Min verbal cues to "meet resistance" to allow for autogenic inhibition  - Sidelying thoracic rotation, x15 B  - Reactive isometrics for shoulder ER, RTB, x10 B,  verbal cueing for technique  - Hook lying LT activation 5sec hold x20    Neuromuscular Re-Education - Justified to address any of the following:   Re-education of movement, balance, coordination, kinesthetic sense, posture and/or proprioception for sitting and/or standing activities.   Added:  Tandem walking forward and walking backwards 2 x 30 ft each, VC for preventing UT compensation/guard position, for improved balance and decreased guarding in neck area with balance.     Not today:  Standing front raise and circles with yellow TB around wrist 3 x 10 each, VC for maintaining tension in band for scapular stability with OH movements  Serratus towel press with pillow case at wall, 2x10 reps bilaterally: Min facilitation of posterior tipping of R scapula; mod tactile cues to R Upper trapezius to limit hiking; min verbal cues for elbow extension and humeral ER for SA engagement  Rows and pull down ,GTB, 2x15, tactile cueing for adequate scapular retraction and posterior depression  standing B shoulder ER and horizontal abduction, GTB, 2x10, verbal/tactile cueing for mid trap activation   Standing A's with red TB, 3x10 reps: Mod verbal cues for elbow extension and supination of BUE for increased post cuff facilitation; focusing on scap stabilizer and RC cuff endurance to reduce forward head posture      Manual Therapy - Justified to address any of the following:    Mobilization of joints and soft tissues, manipulation, manual lymphatic drainage, and/or manual traction.    Supine:    STM to b/l levator, posterior and middle scalenes, UT  - PPM cervical rotation and sidebending grade 3 with manual scapular depression b/l  - SOR with Manual cervical traction grade 2-3  First rib mob R grade 2-3         Home Exercises   Access Code: [email or web address]  URL: [email or web address]  Date: 03/27/2021  Prepared by: Estrellita Ludwig    Exercises  Shoulder Extension with Resistance - 1 x daily - 7 x weekly - 3 sets - 10  reps  Standing Row with Anchored Resistance - 1 x daily - 7 x weekly - 3 sets - 10 reps  Push Up on Table - 1 x daily - 7 x weekly - 3 sets - 10 reps  Standing Shoulder Flexion to 90 Degrees with Dumbbells - 1 x daily - 7 x weekly - 3 sets - 10 reps  Shoulder Abduction with Dumbbells - Thumbs Up - 1 x daily - 7 x weekly - 3 sets - 10 reps  Shoulder Overhead Press in Flexion with Dumbbells - 1 x daily - 7 x weekly - 3 sets - 10 reps  Shoulder Flexion Serratus Activation with Resistance - 1 x daily - 7 x weekly - 3 sets - 10 reps                     Assessment   Education regarding breathing with head turns to both sides when swimming, and slowly increasing frequency and duration of swimming  R first rib elevated, improved post MT  Tandem walking and backwards walking performed with focus on preventing UT tightness in guard position     Pt would continue to benefit from skilled PT to address cervical and upper thoracic mobility deficits, improve periscapular strength, endurance and motor control.      Plan   Continue STM, cervical mobility, deep neck flexor and scapular stability exercises   Shift focus to strengthening  Lenise Herald, DPT

## 2021-04-03 ENCOUNTER — Inpatient Hospital Stay: Payer: Medicare Other | Admitting: Physical Therapist

## 2021-04-08 ENCOUNTER — Inpatient Hospital Stay: Payer: Medicare Other | Admitting: Physical Therapist

## 2021-04-10 ENCOUNTER — Inpatient Hospital Stay: Payer: Medicare Other | Admitting: Physical Therapist

## 2021-04-15 ENCOUNTER — Inpatient Hospital Stay: Payer: Medicare Other | Admitting: Physical Therapist

## 2021-04-15 DIAGNOSIS — M542 Cervicalgia: Secondary | ICD-10-CM | POA: Insufficient documentation

## 2021-04-15 NOTE — PT/OT Therapy Note (Signed)
Name:Brenda Stevens Age: 75 y.o.   Date of Service: 04/15/2021  Referring Physician: Bertram Gala,*   Date of Injury: 01/13/2021  Date Care Plan Established/Reviewed: 02/13/2021  Date Treatment Started: 02/13/2021  End of Certification Date: 05/13/2021  Sessions in Plan of Care: 16  Surgery Date: No data was found  MD Follow-up: No data was found  Medbridge Code: No data was found    Visit Count: 12   Diagnosis:   1. Cervical pain            Subjective     Daily Subjective   Pt reports neck was tight on plane, did a bunch of yard work and it felt fine. Ready for next session to be her last.     Social Support/Occupation              Occupation: retired    Precautions: No data was found  Allergies: Patient has no allergy information on record.    No past medical history on file.    Initial Evaluation Reference and/or Current Measurements(as dated):    Outcomes:  Goal: 66 Initial  06/09        7/19            Primary Functional Status Measure 55  85         NDI 28            Pain: /10 /10 /10 /10 /10   Pain last 24 hrs 5            Least pain last 30 days  8           Greatest pain last 30 days  8           PSFS Activities: /10 /10 /10 /10 /10   1. mobility, steps  5           2. posture  5           3. lifting, carrying  5              Posture   IE 02/13/21: forward head, rounded shoulders, protracted scapulae     Palpation   IE 02/13/21: Negative spurling, positive distraction/compression; TTP and tightness SCM, scalenes, LS, UT, thoracic and cervical paraspinals  b/l; Elevated and TTP 1st rib b/l; TTP C4-6, hypomobility throughout cervical and thoracic spine; R shoulder elevated and protracted compared to L      Cervical AROM  Initial   06/09     7/5  7/12  8/9     Flexion 30 p       35     Extension 20 p       25     Side Bend R 15 p      20     Side Bend L 5 p  15 deg, nil pain    20     Rotation R 40 p     45 p  50     Rotation L 40 p     45   60     (blank fields were intentionally left blank)     UE  Strength  PSI Initial R  06/09 Initial L  06/09       R  7/14    L  7/14    R  8/9    L  8/9    R    L    R    L  Shoulder Flex 5.6 p 7.6 p 6.3   7.3  6.4 6.6            Shoulder Abd (C5) 4.6 p 5.4 p 5.7   4.8 7.3   5.5           Shoulder Ext                       Shoulder ER                       Shoulder IR                       Rhomboids                       Serratus                       Upper Trap (C4)                     Mid Trap (C4) sitting   4.6 p 6.0 p   7.2  9.3 7.4  7.0            Lower Trap (C4)                       Biceps (C6)                       Triceps (C7)                       Wrist Ext (C7)                       Wrist Flex (C7)                       (blank fields were intentionally left blank)      Objective                   Treatment     Therapeutic Exercises - Justified to address any of the following:  To develop strength, endurance, ROM and/or flexibility.   UBE L5 X 6 min (3 min fwd/3 min backwards) w/subjective discussion to guide treatment session    Assessed strength ROM, see above, discussion of current and previous measures and progress towards goals    Added:   Bent over extension #2, rear delt #2 and row #5 2 x 10 each, VC for scapular retraction     Not today:   standing FM row #15 2 x 10 and pull down #10 3 x 10, lat pull sitting #17.5 3 x 10, VC for form   Push up on table 2 x 10, VC for keeping shoulders away from ears  Standing flexion and abduction b/l #2 3 x 10 each, VC for preventing UT compensation   - standing B shoulder hori abd and b/l ER, RTB, 2x10 verbal cueing for slow controlled motion  Standing OH Press #2 b/l 3 x 10, VC for neutral head position   quadruped cat camel with pillow prop underneath knee to accommodate knee replacement, 3x8 reps: Min verbal cues for elbow extension and knuckle placement underneath shoulders to reduce deltoid strain  Supine R UT stretch, 4x30" with scap depression   Seated middle scalene isometrics, 2x5x5" holds:  Min verbal cues to  "meet resistance" to allow for autogenic inhibition  - Sidelying thoracic rotation, x15 B  - Reactive isometrics for shoulder ER, RTB, x10 B, verbal cueing for technique  - Hook lying LT activation 5sec hold x20    Neuromuscular Re-Education - Justified to address any of the following:   Re-education of movement, balance, coordination, kinesthetic sense, posture and/or proprioception for sitting and/or standing activities.   Added:  SLS on airex x 1 min each side, NBOS on airex with head turns x 1 min with cueing to facilitate TrA, quad & glut recruitment & foot/ankle muscle co-contractions to decrease LOB and knee stability during gait.    Walking with head turns L and R, 4 x 30 ft, VC for reducing speed of head turn, keeping feet wide, and keeping shoulders straight ahead. to decrease LOB and knee stability during gait.    Not today:  Tandem walking forward and walking backwards 2 x 30 ft each, VC for preventing UT compensation/guard position, for improved balance and decreased guarding in neck area with balance.   Standing front raise and circles with yellow TB around wrist 3 x 10 each, VC for maintaining tension in band for scapular stability with OH movements  Serratus towel press with pillow case at wall, 2x10 reps bilaterally: Min facilitation of posterior tipping of R scapula; mod tactile cues to R Upper trapezius to limit hiking; min verbal cues for elbow extension and humeral ER for SA engagement  Rows and pull down ,GTB, 2x15, tactile cueing for adequate scapular retraction and posterior depression  standing B shoulder ER and horizontal abduction, GTB, 2x10, verbal/tactile cueing for mid trap activation   Standing A's with red TB, 3x10 reps: Mod verbal cues for elbow extension and supination of BUE for increased post cuff facilitation; focusing on scap stabilizer and RC cuff endurance to reduce forward head posture      Manual Therapy - Justified to address any of the following:    Mobilization of joints  and soft tissues, manipulation, manual lymphatic drainage, and/or manual traction.    Supine:    STM to b/l levator, posterior and middle scalenes, UT, cervical extensors           Home Exercises   Access Code: [email or web address]  URL: [email or web address]  Date: 03/27/2021  Prepared by: Estrellita Ludwig    Exercises  Shoulder Extension with Resistance - 1 x daily - 7 x weekly - 3 sets - 10 reps  Standing Row with Anchored Resistance - 1 x daily - 7 x weekly - 3 sets - 10 reps  Push Up on Table - 1 x daily - 7 x weekly - 3 sets - 10 reps  Standing Shoulder Flexion to 90 Degrees with Dumbbells - 1 x daily - 7 x weekly - 3 sets - 10 reps  Shoulder Abduction with Dumbbells - Thumbs Up - 1 x daily - 7 x weekly - 3 sets - 10 reps  Shoulder Overhead Press in Flexion with Dumbbells - 1 x daily - 7 x weekly - 3 sets - 10 reps  Shoulder Flexion Serratus Activation with Resistance - 1 x daily - 7 x weekly - 3 sets - 10 reps       ---      Flowsheet Row ---   Total Time    Timed Minutes 42 minutes   Total Time 42 minutes  Assessment   ROM improved in all planes, pain free  Slight improvements in strength in flex/ab, minimal in MT strength, addressed with exercises   Pt challenged with SLS on airex, and walking with head turns improved with VC for upright posture and continued reps    Pt would continue to benefit from skilled PT to address cervical and upper thoracic mobility deficits, improve periscapular strength, endurance and motor control.      Plan   [place] next session- add strength and balance to HEP       Goals      Goal 1: Improve Physical FS Primary Measure from 55 to 66 or better to demonstrate change for significant functional improvement.     7/19 SG met      Sessions: 16      Goal 2: Patient will demonstrate independence in prescribed HEP with proper form, sets and reps for safe discharge to an independent program.    7/19 SG pt compliant    Sessions: 16      Goal 3: Patient will demonstrate  cervical rotation AROM of greater than or equal to 70 degrees bilaterally to increase visual field to allow patient to check blind spots safely, change lanes safely, and back out of parking spots without pain    7/12 progressing          Sessions: 16      Goal 4: Patient will demonstrate >15 PSI of flexion and abduction to improve dynamic stability to allow patient to lift groceries from floor without pain    7/14 minimal change          Sessions: 16                                      Estrellita Ludwig, DPT

## 2021-04-17 ENCOUNTER — Inpatient Hospital Stay: Payer: Medicare Other | Admitting: Physical Therapist

## 2021-04-17 DIAGNOSIS — M542 Cervicalgia: Secondary | ICD-10-CM

## 2021-04-17 NOTE — Progress Notes (Signed)
Name:Brenda Stevens Age: 75 y.o.   Date of Service: 04/17/2021  Referring Physician: Bertram Gala,*   Date of Injury: 01/13/2021  Date Care Plan Established/Reviewed: No data was found  Date Treatment Started: No data was found  End of Certification Date: 05/13/2021  Sessions in Plan of Care: 16  Surgery Date: No data was found  MD Follow-up: No data was found  Medbridge Code: No data was found    Visit Count: 13   Diagnosis:   1. Cervical pain        Subjective     Social Support/Occupation              Occupation: retired    Precautions: No data was found  Allergies: Patient has no allergy information on record.    No past medical history on file.    Objective                         ---      Flowsheet Row ---   Total Time    Timed Minutes 39 minutes   Total Time 39 minutes              Subjective     Daily Subjective   Pt reports she did some swimming yesterday and neck felt good. Ready for [place] today.       Initial Evaluation Reference and/or Current Measurements(as dated):    Outcomes:  Goal: 66 Initial  06/09        7/19  8/11          Primary Functional Status Measure 55  85 79        NDI 28            Pain: /10 /10 /10 /10 /10   Pain last 24 hrs 5            Least pain last 30 days  8           Greatest pain last 30 days  8           PSFS Activities: /10 /10 /10 /10 /10   1. mobility, steps  5           2. posture  5           3. lifting, carrying  5              Posture   IE 02/13/21: forward head, rounded shoulders, protracted scapulae     Palpation   IE 02/13/21: Negative spurling, positive distraction/compression; TTP and tightness SCM, scalenes, LS, UT, thoracic and cervical paraspinals  b/l; Elevated and TTP 1st rib b/l; TTP C4-6, hypomobility throughout cervical and thoracic spine; R shoulder elevated and protracted compared to L      Cervical AROM  Initial   06/09     7/5  7/12  8/9     Flexion 30 p       35     Extension 20 p       25     Side Bend R 15 p      20     Side Bend L 5 p  15 deg,  nil pain    20     Rotation R 40 p     45 p  50     Rotation L 40 p     45   60     (blank fields were intentionally  left blank)     UE Strength  PSI Initial R  06/09 Initial L  06/09       R  7/14    L  7/14    R  8/9    L  8/9    R    L    R    L   Shoulder Flex 5.6 p 7.6 p 6.3   7.3  6.4 6.6            Shoulder Abd (C5) 4.6 p 5.4 p 5.7   4.8 7.3   5.5           Shoulder Ext                       Shoulder ER                       Shoulder IR                       Rhomboids                       Serratus                       Upper Trap (C4)                     Mid Trap (C4) sitting   4.6 p 6.0 p   7.2  9.3 7.4  7.0            Lower Trap (C4)                       Biceps (C6)                       Triceps (C7)                       Wrist Ext (C7)                       Wrist Flex (C7)                       (blank fields were intentionally left blank)          Assessment   Patient has been seen for 13 visits in this case from 02/13/2021 until 04/17/2021. The encounter diagnosis was Cervical pain. Patient has made excellent  progress in pain, posture, range of motion, strength, joint mobility, soft tissue mobility, static balance, and dynamic balance since the initial evaluation. Patient is ready for discharge from skilled therapy intervention at this time.     Reviewed full HEP program with education/instruction on how to progress exercises (frequency/duration) with sets, reps, hold times, and resistance to promote continued improvement for ROM/flexibility/strength/balance.  Special attention given to instruct patient to not push HEP to the point of pain and to contact our office if questions occur. Advised to follow-up with MD as prescribed/as needed.  Handout provided to patient with above information and phone numbers.  Plan   Recommend discharge from PT at this time. Pt has been instructed in comprehensive HEP and demonstrates understanding for self maintenance. Pt has been advised to continue HEP and follow up with  PT or Referring Provider if any further issues arise.  Goals      Goal 1: Improve Physical FS Primary Measure from 55 to 66 or better to demonstrate change for significant functional improvement.     8/11 met     Sessions: 16      Goal 2: Patient will demonstrate independence in prescribed HEP with proper form, sets and reps for safe discharge to an independent program.    8/11 updated    Sessions: 16      Goal 3: Patient will demonstrate cervical rotation AROM of greater than or equal to 70 degrees bilaterally to increase visual field to allow patient to check blind spots safely, change lanes safely, and back out of parking spots without pain    8/9 met function          Sessions: 16      Goal 4: Patient will demonstrate >15 PSI of flexion and abduction to improve dynamic stability to allow patient to lift groceries from floor without pain    8/9 met function          Sessions: 16                                  Estrellita Ludwig, DPT

## 2021-04-17 NOTE — PT/OT Therapy Note (Signed)
Name:Brenda Stevens Age: 75 y.o.   Date of Service: 04/17/2021  Referring Physician: Bertram Gala,*   Date of Injury: 01/13/2021  Date Care Plan Established/Reviewed: 02/13/2021  Date Treatment Started: 02/13/2021  End of Certification Date: 05/13/2021  Sessions in Plan of Care: 16  Surgery Date: No data was found  MD Follow-up: No data was found  Medbridge Code: No data was found    Visit Count: 13   Diagnosis:   1. Cervical pain            Subjective     Daily Subjective   Pt reports she did some swimming yesterday and neck felt good. Ready for [place] today.     Social Support/Occupation              Occupation: retired    Precautions: No data was found  Allergies: Patient has no allergy information on record.    No past medical history on file.    Initial Evaluation Reference and/or Current Measurements(as dated):    Outcomes:  Goal: 66 Initial  06/09        7/19  8/11          Primary Functional Status Measure 55  85 79        NDI 28            Pain: /10 /10 /10 /10 /10   Pain last 24 hrs 5            Least pain last 30 days  8           Greatest pain last 30 days  8           PSFS Activities: /10 /10 /10 /10 /10   1. mobility, steps  5           2. posture  5           3. lifting, carrying  5              Posture   IE 02/13/21: forward head, rounded shoulders, protracted scapulae     Palpation   IE 02/13/21: Negative spurling, positive distraction/compression; TTP and tightness SCM, scalenes, LS, UT, thoracic and cervical paraspinals  b/l; Elevated and TTP 1st rib b/l; TTP C4-6, hypomobility throughout cervical and thoracic spine; R shoulder elevated and protracted compared to L      Cervical AROM  Initial   06/09     7/5  7/12  8/9     Flexion 30 p       35     Extension 20 p       25     Side Bend R 15 p      20     Side Bend L 5 p  15 deg, nil pain    20     Rotation R 40 p     45 p  50     Rotation L 40 p     45   60     (blank fields were intentionally left blank)     UE Strength  PSI Initial R  06/09  Initial L  06/09       R  7/14    L  7/14    R  8/9    L  8/9    R    L    R    L   Shoulder Flex 5.6 p 7.6 p 6.3   7.3  6.4 6.6            Shoulder Abd (C5) 4.6 p 5.4 p 5.7   4.8 7.3   5.5           Shoulder Ext                       Shoulder ER                       Shoulder IR                       Rhomboids                       Serratus                       Upper Trap (C4)                     Mid Trap (C4) sitting   4.6 p 6.0 p   7.2  9.3 7.4  7.0            Lower Trap (C4)                       Biceps (C6)                       Triceps (C7)                       Wrist Ext (C7)                       Wrist Flex (C7)                       (blank fields were intentionally left blank)      Objective                   Treatment     Therapeutic Exercises - Justified to address any of the following:  To develop strength, endurance, ROM and/or flexibility.   UBE L5 X 6 min (3 min fwd/3 min backwards) w/subjective discussion to guide treatment session    Assessed FOTO, see above, discussion of current and previous measures and progress towards goals    Performed progressed HEP with discussion on frequency and progression:     Shoulder Extension with Resistance - 1 x daily - 7 x weekly - 3 sets - 10 reps  Standing Row with Anchored Resistance - 1 x daily - 7 x weekly - 3 sets - 10 reps  Push Up on Table - 1 x daily - 7 x weekly - 3 sets - 10 reps  Standing Shoulder Flexion to 90 Degrees with Dumbbells - 1 x daily - 7 x weekly - 3 sets - 10 reps  Shoulder Abduction with Dumbbells - Thumbs Up - 1 x daily - 7 x weekly - 3 sets - 10 reps  Shoulder Overhead Press in Flexion with Dumbbells - 1 x daily - 7 x weekly - 3 sets - 10 reps  Shoulder Flexion Serratus Activation with Resistance - 1 x daily - 7 x weekly - 3 sets - 10 reps  Bent Over Single Arm Shoulder Row with Dumbbell - 1 x daily - 7 x weekly - 3 sets -  10 reps  Single Arm Bent Over Shoulder Horizontal Abduction with Dumbbell - Palm Down - 1 x daily - 7 x weekly - 3  sets - 10 reps  Single Arm Bent Over Shoulder Extension with Dumbbell - 1 x daily - 7 x weekly - 3 sets - 10 reps  Standing Shoulder Horizontal Abduction with Resistance - 1 x daily - 7 x weekly - 3 sets - 10 reps  Shoulder Overhead Press in Flexion with Dumbbells - 1 x daily - 7 x weekly - 3 sets - 10 reps  Cat Cow - 1 x daily - 7 x weekly - 3 sets - 10 reps  Sidelying Thoracic Rotation with Open Book - 1 x daily - 7 x weekly - 3 sets - 10 reps  Single Leg Stance on Foam Pad - 1 x daily - 7 x weekly - 3 sets - 10 reps  Tandem Walking - 1 x daily - 7 x weekly - 3 sets - 10 reps      Home Exercises   Access Code: [email or web address]  URL: [email or web address]  Date: 04/17/2021  Prepared by: Estrellita Ludwig    Exercises  Shoulder Extension with Resistance - 1 x daily - 7 x weekly - 3 sets - 10 reps  Standing Row with Anchored Resistance - 1 x daily - 7 x weekly - 3 sets - 10 reps  Push Up on Table - 1 x daily - 7 x weekly - 3 sets - 10 reps  Standing Shoulder Flexion to 90 Degrees with Dumbbells - 1 x daily - 7 x weekly - 3 sets - 10 reps  Shoulder Abduction with Dumbbells - Thumbs Up - 1 x daily - 7 x weekly - 3 sets - 10 reps  Shoulder Overhead Press in Flexion with Dumbbells - 1 x daily - 7 x weekly - 3 sets - 10 reps  Shoulder Flexion Serratus Activation with Resistance - 1 x daily - 7 x weekly - 3 sets - 10 reps  Bent Over Single Arm Shoulder Row with Dumbbell - 1 x daily - 7 x weekly - 3 sets - 10 reps  Single Arm Bent Over Shoulder Horizontal Abduction with Dumbbell - Palm Down - 1 x daily - 7 x weekly - 3 sets - 10 reps  Single Arm Bent Over Shoulder Extension with Dumbbell - 1 x daily - 7 x weekly - 3 sets - 10 reps  Standing Shoulder Horizontal Abduction with Resistance - 1 x daily - 7 x weekly - 3 sets - 10 reps  Shoulder Overhead Press in Flexion with Dumbbells - 1 x daily - 7 x weekly - 3 sets - 10 reps  Cat Cow - 1 x daily - 7 x weekly - 3 sets - 10 reps  Sidelying Thoracic Rotation with Open Book - 1 x  daily - 7 x weekly - 3 sets - 10 reps  Single Leg Stance on Foam Pad - 1 x daily - 7 x weekly - 3 sets - 10 reps  Tandem Walking - 1 x daily - 7 x weekly - 3 sets - 10 reps         ---      Flowsheet Row ---   Total Time    Timed Minutes 39 minutes   Total Time 39 minutes                Assessment   Patient has been seen for 13 visits in this case  from 02/13/2021 until 04/17/2021. The encounter diagnosis was Cervical pain. Patient has made excellent  progress in pain, posture, range of motion, strength, joint mobility, soft tissue mobility, static balance, and dynamic balance since the initial evaluation. Patient is ready for discharge from skilled therapy intervention at this time.     Reviewed full HEP program with education/instruction on how to progress exercises (frequency/duration) with sets, reps, hold times, and resistance to promote continued improvement for ROM/flexibility/strength/balance.  Special attention given to instruct patient to not push HEP to the point of pain and to contact our office if questions occur. Advised to follow-up with MD as prescribed/as needed.  Handout provided to patient with above information and phone numbers.  Plan   Recommend discharge from PT at this time. Pt has been instructed in comprehensive HEP and demonstrates understanding for self maintenance. Pt has been advised to continue HEP and follow up with PT or Referring Provider if any further issues arise.     Goals      Goal 1: Improve Physical FS Primary Measure from 55 to 66 or better to demonstrate change for significant functional improvement.     8/11 met     Sessions: 16      Goal 2: Patient will demonstrate independence in prescribed HEP with proper form, sets and reps for safe discharge to an independent program.    8/11 updated    Sessions: 16      Goal 3: Patient will demonstrate cervical rotation AROM of greater than or equal to 70 degrees bilaterally to increase visual field to allow patient to check blind spots  safely, change lanes safely, and back out of parking spots without pain    8/9 met function          Sessions: 16      Goal 4: Patient will demonstrate >15 PSI of flexion and abduction to improve dynamic stability to allow patient to lift groceries from floor without pain    8/9 met function          Sessions: 16                                      Estrellita Ludwig, DPT

## 2021-05-16 ENCOUNTER — Ambulatory Visit (INDEPENDENT_AMBULATORY_CARE_PROVIDER_SITE_OTHER): Payer: Medicare Other | Admitting: Family Medicine

## 2021-05-16 ENCOUNTER — Encounter (INDEPENDENT_AMBULATORY_CARE_PROVIDER_SITE_OTHER): Payer: Self-pay | Admitting: Family Medicine

## 2021-05-16 VITALS — BP 127/73 | HR 69 | Temp 98.5°F | Resp 18 | Ht 63.0 in | Wt 162.0 lb

## 2021-05-16 DIAGNOSIS — S90464A Insect bite (nonvenomous), right lesser toe(s), initial encounter: Secondary | ICD-10-CM

## 2021-05-16 DIAGNOSIS — W57XXXA Bitten or stung by nonvenomous insect and other nonvenomous arthropods, initial encounter: Secondary | ICD-10-CM

## 2021-05-16 DIAGNOSIS — L03031 Cellulitis of right toe: Secondary | ICD-10-CM

## 2021-05-16 MED ORDER — SULFAMETHOXAZOLE-TRIMETHOPRIM 800-160 MG PO TABS
1.0000 | ORAL_TABLET | Freq: Two times a day (BID) | ORAL | 0 refills | Status: AC
Start: 2021-05-16 — End: 2021-05-23

## 2021-05-16 NOTE — Progress Notes (Signed)
[facility]  PROGRESS NOTE     Patient: Brenda Stevens   Date: 05/16/2021   MRN: [id number]       Lenice Koper is a 75 y.o. female      HISTORY     History obtained from: Patient    Chief Complaint   Patient presents with    Insect Bite     Started noticing a week ago, right foot is swollen and pinky toe, pt states painful and has been icing it. Suspected bug bite but is unsure. Tender to the touch and achy at rest. Pain sometimes radiates up the leg. Numbness only occurred once when it was at it's worst but it has improved. No otc today.        6 days ago notice pain in 5th toe, stingy, severe pain or bite and foot swelling. Outside frequently.   Soaked in epsom salt/iced.  Swelling better.    Able to walk nl.   Painful with touching. Over all pain better but still with lots of swelling and pain at the site.             Review of Systems   Constitutional:  Negative for chills, diaphoresis and fever.   HENT:  Negative for congestion and sore throat.    Respiratory:  Negative for cough and shortness of breath.    Cardiovascular:  Negative for chest pain.   Gastrointestinal:  Negative for nausea and vomiting.   Musculoskeletal:  Negative for arthralgias, back pain, myalgias and neck pain.   Skin:  Positive for color change and wound. Negative for rash.   Allergic/Immunologic: Negative for environmental allergies.   Neurological:  Negative for light-headedness, numbness and headaches.   Hematological:  Negative for adenopathy.   Psychiatric/Behavioral:  Negative for confusion.      History:    Pertinent Past Medical, Surgical, Family and Social History were reviewed.        Current Outpatient Medications:     amLODIPine (NORVASC) 5 MG tablet, , Disp: , Rfl:     aspirin 325 MG tablet, every 24 hours, Disp: , Rfl:     atorvastatin (LIPITOR) 40 MG tablet, atorvastatin 40 mg tablet, Disp: , Rfl:     clindamycin (CLEOCIN) 300 MG capsule, clindamycin HCl 300 mg capsule, Disp: , Rfl:     hydroCHLOROthiazide  (HYDRODIURIL) 25 MG tablet, , Disp: , Rfl:     irbesartan (AVAPRO) 150 MG tablet, , Disp: , Rfl:     metoprolol tartrate (LOPRESSOR) 25 MG tablet, metoprolol tartrate 25 mg tablet, Disp: , Rfl:     sulfamethoxazole-trimethoprim (BACTRIM DS) 800-160 MG per tablet, Take 1 tablet by mouth 2 (two) times daily for 7 days, Disp: 14 tablet, Rfl: 0    Allergies   Allergen Reactions    Amoxicillin Nausea And Vomiting       Medications and Allergies reviewed.    PHYSICAL EXAM     Vitals:    05/16/21 1121   BP: 127/73   Pulse: 69   Resp: 18   Temp: 98.5 F (36.9 C)   TempSrc: Tympanic   SpO2: 98%   Weight: 73.5 kg (162 lb)   Height: 1.6 m (5\' 3" )       Physical Exam  Constitutional:       General: She is not in acute distress.     Appearance: Normal appearance. She is well-developed.   HENT:      Head: Normocephalic and atraumatic.  Eyes: Conjunctivae are normal. No scleral icterus. Cardiovascular:      Rate and Rhythm: Normal rate.   Pulmonary:      Effort: Pulmonary effort is normal.   Neurological:      Mental Status: She is alert and oriented to person, place, and time.   Skin:     General: Skin is warm.      Coloration: Skin is not pale.      Findings: Erythema present.      Comments: Right foot with diffuse swelling from mid foot distally, with significant swelling on 5th digit, redness surrounding a scab,  TTP, nl cap refill, no discharge, no fluctulance, nl pedal pulses,    Vitals and nursing note reviewed.       UCC COURSE     There were no labs reviewed with this patient during the visit.    There were no x-rays reviewed with this patient during the visit.    No current facility-administered medications for this visit.       PROCEDURES     Procedures    MEDICAL DECISION MAKING         ASSESSMENT     Encounter Diagnoses   Name Primary?    Insect bite of lesser toe of right foot, initial encounter Yes    Cellulitis of toe of right foot             PLAN      PLAN:   Likely insect bite, now complicated by skin  infection    Start antibiotics.   If not better or worse, return or see your doctor - you may need additional antibiotics for staph coverage as discussed or it may have developed into an abscess and need to drain it.    Warm compresses for infection.   Wash with soap and water.   Keep hands off.   Elevated leg to help reduce swelling.    Monitor for skin infection and return if any worsening of swelling, pain, redness.  Do not wait for pus, fever, red lines.      Return if any sign of infection getting worse - worsening of redness, swelling, pain.    Do not wait for pus, red streaks or fever.    Otherwise follow up with your doctor in 3-5 days.           No orders of the defined types were placed in this encounter.    Requested Prescriptions     Signed Prescriptions Disp Refills    sulfamethoxazole-trimethoprim (BACTRIM DS) 800-160 MG per tablet 14 tablet 0     Sig: Take 1 tablet by mouth 2 (two) times daily for 7 days       Discussed results and diagnosis with patient/family.  Reviewed warning signs for worsening condition, as well as, indications for follow-up with primary care physician and return to urgent care clinic.   Patient/family expressed understanding of instructions.     An After Visit Summary was provided to the patient.

## 2021-05-16 NOTE — Patient Instructions (Signed)
Likely insect bite, now complicated by skin infection    Start antibiotics.   If not better or worse, return or see your doctor - you may need additional antibiotics for staph coverage as discussed or it may have developed into an abscess and need to drain it.    Warm compresses for infection.   Wash with soap and water.   Keep hands off.   Elevated leg to help reduce swelling.    Monitor for skin infection and return if any worsening of swelling, pain, redness.  Do not wait for pus, fever, red lines.      Return if any sign of infection getting worse - worsening of redness, swelling, pain.    Do not wait for pus, red streaks or fever.    Otherwise follow up with your doctor in 3-5 days.

## 2022-01-17 ENCOUNTER — Emergency Department
Admission: EM | Admit: 2022-01-17 | Discharge: 2022-01-17 | Disposition: A | Discharge status: Home / Self Care | Payer: Medicare Other | Attending: Emergency Medicine | Admitting: Emergency Medicine

## 2022-01-17 ENCOUNTER — Emergency Department: Payer: Medicare Other

## 2022-01-17 DIAGNOSIS — J209 Acute bronchitis, unspecified: Secondary | ICD-10-CM

## 2022-01-17 DIAGNOSIS — Z880 Allergy status to penicillin: Secondary | ICD-10-CM | POA: Insufficient documentation

## 2022-01-17 DIAGNOSIS — N390 Urinary tract infection, site not specified: Secondary | ICD-10-CM | POA: Insufficient documentation

## 2022-01-17 DIAGNOSIS — J45909 Unspecified asthma, uncomplicated: Secondary | ICD-10-CM | POA: Insufficient documentation

## 2022-01-17 LAB — COMPREHENSIVE METABOLIC PANEL
ALT: 8 U/L (ref 0–55)
AST (SGOT): 15 U/L (ref 5–41)
Albumin/Globulin Ratio: 1 (ref 0.9–2.2)
Albumin: 3.5 g/dL (ref 3.5–5.0)
Alkaline Phosphatase: 102 U/L (ref 37–117)
Anion Gap: 13 (ref 5.0–15.0)
BUN: 21 mg/dL (ref 7.0–21.0)
Bilirubin, Total: 0.5 mg/dL (ref 0.2–1.2)
CO2: 24 mEq/L (ref 17–29)
Calcium: 9.6 mg/dL (ref 7.9–10.2)
Chloride: 103 mEq/L (ref 99–111)
Creatinine: 0.7 mg/dL (ref 0.4–1.0)
Globulin: 3.6 g/dL (ref 2.0–3.6)
Glucose: 109 mg/dL — ABNORMAL HIGH (ref 70–100)
Potassium: 4 mEq/L (ref 3.5–5.3)
Protein, Total: 7.1 g/dL (ref 6.0–8.3)
Sodium: 140 mEq/L (ref 135–145)
eGFR: >60 mL/min/{1.73_m2} (ref >=60)

## 2022-01-17 LAB — ECG 12-LEAD
Atrial Rate: 99 {beats}/min
IHS MUSE NARRATIVE AND IMPRESSION: NORMAL
P Axis: 22 degrees
P-R Interval: 182 ms
Q-T Interval: 406 ms
QRS Duration: 150 ms
QTC Calculation (Bezet): 521 ms
R Axis: -67 degrees
T Axis: 82 degrees
Ventricular Rate: 99 {beats}/min

## 2022-01-17 LAB — CBC AND DIFFERENTIAL
Absolute NRBC: 0 10*3/uL (ref 0.00–0.00)
Basophils Absolute Automated: 0.08 10*3/uL (ref 0.00–0.08)
Basophils Automated: 0.6 %
Eosinophils Absolute Automated: 0.06 10*3/uL (ref 0.00–0.44)
Eosinophils Automated: 0.5 %
Hematocrit: 36.9 % (ref 34.7–43.7)
Hgb: 12.5 g/dL (ref 11.4–14.8)
Immature Granulocytes Absolute: 0.05 10*3/uL (ref 0.00–0.07)
Immature Granulocytes: 0.4 %
Instrument Absolute Neutrophil Count: 9.17 10*3/uL — ABNORMAL HIGH (ref 1.10–6.33)
Lymphocytes Absolute Automated: 1.99 10*3/uL (ref 0.42–3.22)
Lymphocytes Automated: 16 %
MCH: 30.6 pg (ref 25.1–33.5)
MCHC: 33.9 g/dL (ref 31.5–35.8)
MCV: 90.4 fL (ref 78.0–96.0)
MPV: 9.4 fL (ref 8.9–12.5)
Monocytes Absolute Automated: 1.05 10*3/uL — ABNORMAL HIGH (ref 0.21–0.85)
Monocytes: 8.5 %
Neutrophils Absolute: 9.17 10*3/uL — ABNORMAL HIGH (ref 1.10–6.33)
Neutrophils: 74 %
Nucleated RBC: 0 /100 WBC (ref 0.0–0.0)
Platelets: 290 10*3/uL (ref 142–346)
RBC: 4.08 10*6/uL (ref 3.90–5.10)
RDW: 14 % (ref 11–15)
WBC: 12.4 10*3/uL — ABNORMAL HIGH (ref 3.10–9.50)

## 2022-01-17 LAB — HIGH SENSITIVITY TROPONIN-I: hs Troponin-I: <2.7 ng/L

## 2022-01-17 LAB — URINALYSIS REFLEX TO MICROSCOPIC EXAM - REFLEX TO CULTURE
Blood, UA: NEGATIVE
Glucose, UA: NEGATIVE
Ketones UA: NEGATIVE
Nitrite, UA: NEGATIVE
Protein, UR: NEGATIVE
RBC, UA: 0 /hpf (ref 0–5)
Specific Gravity UA: 1.026 (ref 1.001–1.035)
Urine pH: 6 (ref 5.0–8.0)
Urobilinogen, UA: 0.2 mg/dL (ref 0.2–2.0)

## 2022-01-17 MED ORDER — ALBUTEROL SULFATE HFA 108 (90 BASE) MCG/ACT IN AERS
2.0000 | INHALATION_SPRAY | Freq: Once | RESPIRATORY_TRACT | Status: AC
Start: 2022-01-17 — End: 2022-01-17
  Administered 2022-01-17: 2 via RESPIRATORY_TRACT
  Filled 2022-01-17: qty 8

## 2022-01-17 MED ORDER — IPRATROPIUM BROMIDE 0.02 % IN SOLN
0.5000 mg | Freq: Once | RESPIRATORY_TRACT | Status: AC
Start: 2022-01-17 — End: 2022-01-17
  Administered 2022-01-17: 0.5 mg via RESPIRATORY_TRACT
  Filled 2022-01-17: qty 2.5

## 2022-01-17 MED ORDER — ALBUTEROL SULFATE (2.5 MG/3ML) 0.083% IN NEBU
2.5000 mg | INHALATION_SOLUTION | Freq: Once | RESPIRATORY_TRACT | Status: AC
Start: 2022-01-17 — End: 2022-01-17
  Administered 2022-01-17: 2.5 mg via RESPIRATORY_TRACT
  Filled 2022-01-17: qty 3

## 2022-01-17 MED ORDER — LEVOFLOXACIN 500 MG PO TABS
500.0000 mg | ORAL_TABLET | Freq: Once | ORAL | Status: AC
Start: 2022-01-17 — End: 2022-01-17
  Administered 2022-01-17: 500 mg via ORAL
  Filled 2022-01-17: qty 1

## 2022-01-17 MED ORDER — LEVOFLOXACIN 500 MG PO TABS
500.0000 mg | ORAL_TABLET | Freq: Every day | ORAL | 0 refills | Status: AC
Start: 2022-01-17 — End: 2022-01-23

## 2022-01-17 MED ORDER — ONDANSETRON 4 MG PO TBDP
4.0000 mg | ORAL_TABLET | Freq: Four times a day (QID) | ORAL | 0 refills | Status: AC | PRN
Start: 2022-01-17 — End: ?

## 2022-01-17 MED ORDER — ALUM & MAG HYDROXIDE-SIMETH 200-200-20 MG/5ML PO SUSP
30.0000 mL | Freq: Once | ORAL | Status: AC
Start: 2022-01-17 — End: 2022-01-17
  Administered 2022-01-17: 30 mL via ORAL
  Filled 2022-01-17: qty 30

## 2022-01-17 MED ORDER — SODIUM CHLORIDE 0.9 % IV BOLUS
1000.0000 mL | Freq: Once | INTRAVENOUS | Status: AC
Start: 2022-01-17 — End: 2022-01-17
  Administered 2022-01-17: 1000 mL via INTRAVENOUS

## 2022-01-17 MED ORDER — FAMOTIDINE 10 MG/ML IV SOLN (WRAP)
20.0000 mg | Freq: Once | INTRAVENOUS | Status: AC
Start: 2022-01-17 — End: 2022-01-17
  Administered 2022-01-17: 20 mg via INTRAVENOUS
  Filled 2022-01-17: qty 2

## 2022-01-17 NOTE — ED Provider Notes (Addendum)
History     Chief Complaint   Patient presents with    Cough    Emesis     Pt with history of HTN, aortic valve replacement and carotid endarterectomy was well until two weeks ago when she thinks she got food poisoning and started to have nausea, vomiting and diarrhea for 2 days, about the same time she also developed a cough which has been productive of clear then yellow discolored sputum, also feels some shortness of breath with exertion on occasion.  She had been able to eat and drink, but the last two days started to vomit again.  She does not smoke, has had bronchitis, but never pneumonia.  She has had a temp up to 100.5.  She had a cardiology eval just a few weeks ago in [place] which included a stress test and echo which were fine by patient report, has a BBB on EKG.  She has had pain in her upper chest, may be esophageal for the past two weeks, no radiation.  Not diaphoretic.  Denies headache, does have sore throat from vomiting, no abd pain or urinary sxs.  No rash.  The diarrhea has not come back.         Past Medical History:   Diagnosis Date    Heart valve replaced        History reviewed. No pertinent surgical history.    History reviewed. No pertinent family history.    Social  Social History     Tobacco Use    Smoking status: Never    Smokeless tobacco: Never   Vaping Use    Vaping status: Never Used   Substance Use Topics    Alcohol use: Yes     Comment: socially    Drug use: Never       .     Allergies   Allergen Reactions    Amoxicillin Nausea And Vomiting       Home Medications               amLODIPine (NORVASC) 5 MG tablet          aspirin 325 MG tablet     every 24 hours     atorvastatin (LIPITOR) 40 MG tablet     atorvastatin 40 mg tablet     clindamycin (CLEOCIN) 300 MG capsule     clindamycin HCl 300 mg capsule     hydroCHLOROthiazide (HYDRODIURIL) 25 MG tablet          irbesartan (AVAPRO) 150 MG tablet          metoprolol tartrate (LOPRESSOR) 25 MG tablet     metoprolol tartrate 25 mg  tablet             Review of Systems   Constitutional:  Positive for fever. Negative for chills and diaphoresis.   HENT:  Positive for sore throat. Negative for trouble swallowing.    Eyes:  Negative for discharge and redness.   Respiratory:  Positive for cough and shortness of breath (with exertion only).    Cardiovascular:  Negative for chest pain, palpitations and leg swelling.   Gastrointestinal:  Positive for diarrhea, nausea and vomiting. Negative for abdominal pain and constipation.   Genitourinary:  Negative for dysuria and frequency.   Skin:  Negative for pallor and rash.   Neurological:  Negative for weakness, numbness and headaches.   Psychiatric/Behavioral:  Negative for agitation. The patient is not nervous/anxious.    All other systems reviewed and are  negative.      Physical Exam    BP: 157/77, Heart Rate: 98, Temp: 98 F (36.7 C), Resp Rate: 18, SpO2: 97 %, Weight: 72 kg    Physical Exam  Vitals and nursing note reviewed.   Constitutional:       General: She is not in acute distress.     Appearance: Normal appearance. She is well-developed. She is not ill-appearing, toxic-appearing or diaphoretic.   HENT:      Head: Normocephalic and atraumatic.      Right Ear: External ear normal.      Left Ear: External ear normal.      Mouth/Throat:      Mouth: Mucous membranes are moist.      Pharynx: Oropharynx is clear. No oropharyngeal exudate or posterior oropharyngeal erythema.   Eyes:      General: No scleral icterus.        Right eye: No discharge.         Left eye: No discharge.      Conjunctiva/sclera: Conjunctivae normal.      Pupils: Pupils are equal, round, and reactive to light.   Neck:      Thyroid: No thyromegaly.      Vascular: No JVD.      Trachea: No tracheal deviation.   Cardiovascular:      Rate and Rhythm: Normal rate and regular rhythm.      Heart sounds: Normal heart sounds. No murmur heard.     No friction rub. No gallop.   Pulmonary:      Effort: Pulmonary effort is normal. No  respiratory distress.      Breath sounds: No stridor. Wheezing (expiratory) present. No rales.   Chest:      Chest wall: No tenderness.   Abdominal:      General: Bowel sounds are normal. There is no distension.      Palpations: Abdomen is soft.      Tenderness: There is no abdominal tenderness. There is no guarding or rebound.   Musculoskeletal:         General: No swelling, tenderness, deformity or signs of injury. Normal range of motion.      Cervical back: Normal range of motion and neck supple. No rigidity or tenderness.      Right lower leg: No edema.      Left lower leg: No edema.      Comments: No Cyanosis or Clubbing   Lymphadenopathy:      Cervical: No cervical adenopathy.   Skin:     General: Skin is warm and dry.      Coloration: Skin is not pale.      Findings: No erythema or rash.   Neurological:      General: No focal deficit present.      Mental Status: She is alert and oriented to person, place, and time.      Cranial Nerves: No cranial nerve deficit.      Sensory: No sensory deficit.      Motor: No weakness.      Gait: Gait normal.   Psychiatric:         Mood and Affect: Mood normal. Mood is not anxious.         Speech: Speech is not slurred.         Behavior: Behavior normal. Behavior is not agitated.         Thought Content: Thought content normal.         Judgment:  Judgment normal.           MDM and ED Course     ED Medication Orders (From admission, onward)      Start Ordered     Status Ordering Provider    01/17/22 1045 01/17/22 1037  albuterol sulfate HFA (PROVENTIL) inhaler 2 puff  RT - Once        Note to Pharmacy: Spacer teaching   Route: Inhalation  Ordered Dose: 2 puff       Last MAR action: Given Yasmin Bronaugh V    01/17/22 1045 01/17/22 1037  levoFLOXacin (LEVAQUIN) tablet 500 mg  Once        Route: Oral  Ordered Dose: 500 mg       Last MAR action: Given Dev Dhondt V    01/17/22 1015 01/17/22 1004  ipratropium (ATROVENT) 0.02 % nebulizer solution 0.5 mg  RT - Once        Route:  Nebulization  Ordered Dose: 0.5 mg       Last MAR action: Given Oseas Detty V    01/17/22 1015 01/17/22 1004  albuterol (PROVENTIL) (2.5 MG/3ML) 0.083% nebulizer solution 2.5 mg  RT - Once        Route: Nebulization  Ordered Dose: 2.5 mg       Last MAR action: Given Ly Bacchi V    01/17/22 0845 01/17/22 0837  albuterol (PROVENTIL) (2.5 MG/3ML) 0.083% nebulizer solution 2.5 mg  RT - Once        Route: Nebulization  Ordered Dose: 2.5 mg       Last MAR action: Given Delano Frate V    01/17/22 0845 01/17/22 0837  ipratropium (ATROVENT) 0.02 % nebulizer solution 0.5 mg  RT - Once        Route: Nebulization  Ordered Dose: 0.5 mg       Last MAR action: Given Wrangler Penning V    01/17/22 0845 01/17/22 0837  sodium chloride 0.9 % bolus 1,000 mL  Once        Route: Intravenous  Ordered Dose: 1,000 mL       Last MAR action: Stopped Phoebe Marter V    01/17/22 0845 01/17/22 0837  famotidine (PEPCID) injection 20 mg  Once        Route: Intravenous  Ordered Dose: 20 mg       Last MAR action: Given Rowen Wilmer V    01/17/22 0845 01/17/22 0837  alum & mag hydroxide-simethicone (MAALOX PLUS) 200-200-20 mg/5 mL suspension 30 mL  Once        Route: Oral  Ordered Dose: 30 mL       Last MAR action: Given Aaryn Sermon V               Medical Decision Making  Chest discomfort improved after GI cocktail.  Feels that breathing is a little better after nebulizer.  Better air movement after neb, still exp wheezes, will repeat neb.    No nausea in ED.    Pt improved after second neb.  Will start on inhaler four times per day and taper off as symptoms improve.  Pt with evidence of UTI on UA.  Will start Levaquin.  She has amoxicillin allergy and has had UTI' s in the past.  Not symptomatic.    Pt will follow up with the primary care doctor referred to.  Chest discomfort probably due to esophagitis.  Will start Pepcid daily.  Will give Zofran in case nausea returns.  Results for orders placed or performed during the hospital  encounter of 01/17/22  -Urine culture:   Specimen: Urine, Clean Catch                                                                                                                    Narrative    Indications for Urine Culture:->Suprapubic Pain/Tenderness or    Dysuria  -CBC and differential:        Result                                            Value                         Ref Range                       WBC                                               12.40 (H)                     3.10 - 9.50 x10 3/uL            Hgb                                               12.5                          11.4 - 14.8 g/dL                Hematocrit                                        36.9                          34.7 - 43.7 %                   Platelets                                         290                           142 - 346 x10 3/uL  RBC                                               4.08                          3.90 - 5.10 x10 6/uL            MCV                                               90.4                          78.0 - 96.0 fL                  MCH                                               30.6                          25.1 - 33.5 pg                  MCHC                                              33.9                          31.5 - 35.8 g/dL                RDW                                               14                            11 - 15 %                       MPV                                               9.4                           8.9 - 12.5 fL                   Instrument Absolute Neutrophil Count              9.17 (H)  1.10 - 6.33 x10 3/uL            Neutrophils                                       74.0                          None %                          Lymphocytes Automated                             16.0                          None %                          Monocytes                                         8.5                            None %                          Eosinophils Automated                             0.5                           None %                          Basophils Automated                               0.6                           None %                          Immature Granulocytes                             0.4                           None %                          Nucleated RBC                                     0.0  0.0 - 0.0 /100 WBC              Neutrophils Absolute                              9.17 (H)                      1.10 - 6.33 x10 3/uL            Lymphocytes Absolute Automated                    1.99                          0.42 - 3.22 x10 3/uL            Monocytes Absolute Automated                      1.05 (H)                      0.21 - 0.85 x10 3/uL            Eosinophils Absolute Automated                    0.06                          0.00 - 0.44 x10 3/uL            Basophils Absolute Automated                      0.08                          0.00 - 0.08 x10 3/uL            Immature Granulocytes Absolute                    0.05                          0.00 - 0.07 x10 3/uL            Absolute NRBC                                     0.00                          0.00 - 0.00 x10 3/uL       -Comprehensive metabolic panel:        Result                                            Value                         Ref Range                       Glucose  109 (H)                       70 - 100 mg/dL                  BUN                                               21.0                          7.0 - 21.0 mg/dL                Creatinine                                        0.7                           0.4 - 1.0 mg/dL                 Sodium                                            140                           135 - 145 mEq/L                 Potassium                                         4.0                           3.5 - 5.3  mEq/L                 Chloride                                          103                           99 - 111 mEq/L                  CO2                                               24                            17 - 29 mEq/L                   Calcium  9.6                           7.9 - 10.2 mg/dL                Protein, Total                                    7.1                           6.0 - 8.3 g/dL                  Albumin                                           3.5                           3.5 - 5.0 g/dL                  AST (SGOT)                                        15                            5 - 41 U/L                      ALT                                               8                             0 - 55 U/L                      Alkaline Phosphatase                              102                           37 - 117 U/L                    Bilirubin, Total                                  0.5                           0.2 - 1.2 mg/dL                 Globulin  3.6                           2.0 - 3.6 g/dL                  Albumin/Globulin Ratio                            1.0                           0.9 - 2.2                       Anion Gap                                         13.0                          5.0 - 15.0                      eGFR                                              >60.0                         >=60 mL/min/1.73 m2        -High Sensitivity Troponin-I at 0 hrs:        Result                                            Value                         Ref Range                       hs Troponin-I                                     <2.7                          SEE BELOW ng/L             -Urinalysis Reflex to Microscopic Exam- Reflex to Culture:        Result                                            Value                         Ref Range                       Urine Type  Urine, Clean Ca                                               Color, UA                                         YELLOW                        Colorless - Yellow              Clarity, UA                                       CLEAR                         Clear - Hazy                    Specific Gravity UA                               1.026                         1.001 - 1.035                   Urine pH                                          6.0                           5.0 - 8.0                       Leukocyte Esterase, UA                            TRACE (A)                     Negative                        Nitrite, UA                                       NEGATIVE                                                      Protein, UR  NEGATIVE                      Negative                        Glucose, UA                                       NEGATIVE                      Negative                        Ketones UA                                        NEGATIVE                      Negative                        Urobilinogen, UA                                  0.2                           0.2 - 2.0 mg/dL                 Bilirubin, UA                                     SMALL (A)                     Negative                        Blood, UA                                         NEGATIVE                      Negative                        RBC, UA                                           0 -2                          0 - 5 /hpf                      WBC, UA  11 - 25 (A)                   0 - 5 /hpf                      Squamous Epithelial Cells, Urine                  0 - 5                         0 - 25 /hpf                   Results for orders placed or performed during the hospital encounter of 01/17/22  -XR Chest 2 Views:                                                                                                                      Narrative    HISTORY: Cough and congestion symptoms.         COMPARISON: None        FINDINGS: TAVR.        The heart is normal in size. The pulmonary vascular pattern is unremarkable.  There is bibasilar atelectasis. There is no effusion. There is no pneumothorax.                                                                                                                         Impression    Bibasilar atelectasis.        Geanie Cooley, MD    01/17/2022 9:34 AM     Number and Complexity of Problems Addressed (select at least one)    Complexity: High: 1 acute or chronic illness or injury that poses a threat to life or bodily function      Presenting acute/chronic problems: Chest pain    Differential diagnosis to include but not limited to: Acute Coronary Syndrome, Pneumonia    Chronic illness impacting care and increasing risk of acute/chronic problems (obesity based on bmi >30, diabetes, hypertension, elderly - 79 or older): Age of 82    ______________________________________________________________________      History obtained from another historian -see HPI or here (parent, spouse,  care giver, ems) : Spouse      I, Lorri Frederick, MD, have been the primary provider for Roberts Gaudy during this Emergency Dept visit.     Oxygen saturation by pulse oximetry is 95%-100%,  Normal.  Interventions: Aerosol Treatment.     Independent interpretation of  EKG:     EKG Interpretation  EKG interpreted independently by me: NSR at 99, Left axis, no acute ST or TW changes.  No prior for comparison.      Independent visualized and interpretation of radiological study by me:     Type of radiological study performed : CXR  Independent Interpretation by me: No infiltrate    Diagnostic tests appropriately considered even if not ultimately performed: CT Chest    ______________________________________________________________________    Risk of Complications and/or Morbidity or Mortality of Patient  Management  (select one if applicable)    Risk: Decision regarding hospitalization or escalation of hospital level of care       Amount and/or Complexity of Data Reviewed  Labs: ordered.  Radiology: ordered.  ECG/medicine tests: ordered.    Risk  OTC drugs.  Prescription drug management.                     Procedures    Clinical Impression & Disposition     Clinical Impression  Final diagnoses:   UTI (urinary tract infection), uncomplicated   Bronchitis with asthma, acute        ED Disposition       ED Disposition   Discharge    Condition   --    Date/Time   Sat Jan 17, 2022 10:37 AM    Comment   Lillianah Boulay discharge to home/self care.    Condition at disposition: Stable                  Discharge Medication List as of 01/17/2022 10:43 AM        START taking these medications    Details   levoFLOXacin (LEVAQUIN) 500 MG tablet Take 1 tablet (500 mg) by mouth daily for 6 days, Starting Sat 01/17/2022, Until Fri 01/23/2022, E-Rx      ondansetron (ZOFRAN-ODT) 4 MG disintegrating tablet Take 1 tablet (4 mg) by mouth every 6 (six) hours as needed for Nausea, Starting Sat 01/17/2022, E-Rx                         Talmadge Chad, MD  01/17/22 1056       Yee Joss, Si Raider, MD  01/17/22 1150

## 2022-01-17 NOTE — Discharge Instructions (Signed)
Your next dose of antibiotic is tomorrow.  Your urine culture will take a few days to come back.  Use the inhaler 2 puffs four times daily, may decrease frequency as symptoms improve.  Return if an worsening of symptoms.  Follow up with primary care doctor.  May use Zofran for nausea if needed.  Take Pepcid twice daily for reflux.

## 2022-01-22 ENCOUNTER — Inpatient Hospital Stay: Payer: Medicare Other | Admitting: Rehabilitative and Restorative Service Providers"

## 2022-01-22 DIAGNOSIS — R42 Dizziness and giddiness: Secondary | ICD-10-CM | POA: Insufficient documentation

## 2022-01-22 DIAGNOSIS — R269 Unspecified abnormalities of gait and mobility: Secondary | ICD-10-CM | POA: Insufficient documentation

## 2022-01-22 DIAGNOSIS — M542 Cervicalgia: Secondary | ICD-10-CM | POA: Insufficient documentation

## 2022-01-22 NOTE — Progress Notes (Signed)
Name:Brenda Stevens Age: 76 y.o.   Date of Service: 01/22/2022  Referring Physician: Bertram Stevens,*   Date of Injury: 11/22/2021  Date Care Plan Established/Reviewed: 01/22/2022  Date Treatment Started: 01/22/2022  Date Care Plan Established/Reviewed No data was found  Date Treatment Started No data was found  (Historic) Date Care Plan Established/Reviewed No data was found  (Historic) Date Treatment Started No data was found   End of Certification Date: 04/21/2022  Sessions in Plan of Care: 10  Surgery Date: No data was found  MD Follow-up: No data was found  Medbridge Code: No data was found    Visit Count: 1   Diagnosis:   1. Dizziness and giddiness    2. Abnormality of gait and mobility    3. 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  Pt states that the dizziness has been progressively getting worse over time.  Walks with purpose, keeping eyes forward and scanning the ground at all times to avoid falls.  Feels off balance and unsteady with gait issues more recently.  Describes a rocking feeling currently even when just sitting.  Denies HAs and sleep disturbances.  Tripped last fall, on a piece of sidewalk, she just didn't clear the ground with her foot and couldn't keep her balance.  Denies worse balance at night and actually feels like she might be better with gait and balance at night    [place]  Recently had food poisoning had to go to ER and feels so weak after that, also feels like the left leg is weaker.  Currently with some bronchitis from aspiration during food poisioning episodes, this has increased her dizziness symptoms    H/O right TKR    Functional Limitations (PLOF): Dizziness at rest  Stairs are more difficult, need to hold on to railings  Symptoms with quick head  motions  Unsteadiness with walking  Unsteadiness with trying to walk narrow  Unsteadiness from a chair    Has had h/o dizziness and symptoms for year with increased symptoms noted recently and increased feeling of being offbalance    Social Support/Occupation              Occupation: retired      Precautions: No data was found  Allergies: Amoxicillin    Past Medical History:   Diagnosis Date    Heart valve replaced        Objective                       Outcome Measure:  Outcomes:   Initial         Primary Functional Status Measure 65     PSFS:      Pain last 24 hrs      Least pain last 30 days      Greatest pain last 30 days              Living Environment:    Dwelling Entrance:   Patient lives with:      OBJECTIVE:  Vitals:          Observation/Posture/Gait/Integumentary  Posture: Deficits noted: Forward Head and Other: bent forward art hips, knees slightly bent, wide BOS, elevated shoulder with right more  than left  Gait: decreased heel strike bilateral, decreased hip extension bilateral, and decreased TKE bilateral  Gait Speed: slow  Integumentary: No wound, lesion or rash noted    Musculoskeletal Screen:  Cervical AROM  Initial        Flexion 42       Extension 21       Side Bend R 10       Side Bend L 20       Rotation R 30       Rotation L 41       (blank fields were intentionally left blank)      Special Tests/Neurological Screen:  Vertebral Artery NT   Spontaneous Nystagmus (fixation present) (-)   Spontaneous Nystagmus (fixation suppressed) NT   Gaze-evoked Nystagmus (fixation present) (-)   Gaze-evoked Nystagmus (fixation suppressed) NT   Smooth Pursuit intact   Saccade Intact   VOR cancellation Intact but very slow   VOR Difficulty, symptoms   Convergence Inatct   Whisper/rubbing test NT   Weber Test NT   Rinne Test NT      R L   Head Thrust Test NT NT   Dix-Hallpike NT NT   Roll Test NT NT     01/22/22  Modified CTSIB: Steady with EO on ground, unsteady on airex, pt fell forward to the left with EC on  airex with increased symptoms noted          Treatment     Therapeutic Exercises - Justified to address any of the following:  To develop strength, endurance, ROM and/or flexibility.   Pt education on POC and HEP with handout and instruction given, all questions answered    Discussed working on balance, strength and gait in clinic, will start neck mobility and eye exercises at home     Home Exercises   Access Code: [email or web address]  URL: [email or web address]  Date: 01/22/2022  Prepared by: Brenda Stevens    Exercises  - Seated Gaze Stabilization with Head Rotation  - 1 x daily - 7 x weekly - 1 sets - 10 reps  - Seated Gaze Stabilization with Head Nod  - 1 x daily - 7 x weekly - 1 sets - 10 reps  - Standing Backward Shoulder Rolls  - 2 x daily - 7 x weekly - 1 sets - 10 reps - 1 hold  - Seated Scapular Retraction  - 2 x daily - 7 x weekly - 1 sets - 10 reps - 5 hold  - Supine Cervical Rotation AROM on Pillow  - 2 x daily - 7 x weekly - 1 sets - 10 reps - 5 hold       ---      Flowsheet Row ---   Total Time    Timed Minutes 15 minutes   Untimed Minutes 25 minutes   Total Time 40 minutes          Assessment   [name] is a 76 y.o. female presenting with Dizziness, gait abnormality and Neck Pain who requires skilled Physical Therapy services.    Clinical presentation: stable - local pain without causing other issues/symptoms  Barriers to therapy: H/o neck pain  H/o Right TKR     Impairments: Decreased static balance  Decreased dynamic balance  dizziness  disequilibrium  impaired VOR cancellation  Functional Limitations (PLOF): Dizziness at rest  Stairs are more difficult, need to hold on to railings  Symptoms with quick head motions  Unsteadiness with walking  Unsteadiness  with trying to walk narrow  Unsteadiness from a chair    Has had h/o dizziness and symptoms for year with increased symptoms noted recently and increased feeling of being offbalance  Prognosis: excellent  Patient is aware of diagnosis, prognosis  and consents to plan of care: Yes  Plan   Visits per week: 2  Number of Sessions: 10  Direct One on One  [id number]: Therapeutic Exercise: To Develop Strength and Endurance, ROM and Flexibility  [id number]: Gait Training  [id number]: Neuromuscular Reeducation  97140: Manual Therapy techniques (mobilization, manipulation, manual traction) (STM/DTM/TPR/ART to shoulder, scapular, thoracic and cervical musculature, jt mobs to GH, AC , scap and csp 1-4, scap distraction, MET, PNF, cupping, taping)  97530: Therapeutic Activities: Dynamic activities to improve functional performance  Dry Needling  Supervised Modalities  97010: Thermal modalities: hot/cold packs  Vestibular therapy    Plan for Next Session -: advance balance and stabilization ex          Goals      Goal 1: Patient will demonstrate independence in prescribed HEP with proper form, sets and reps for safe discharge to an independent program.   Sessions: 10     Goal 2: Pt will be able to perform VOR and VOR cancellation tests without symptoms normally to be able to move her head quickly without increased symptoms   Sessions: 10     Goal 3: Increase balance to be able to stand on airex pad with EO for 30 sec without symptoms or LOB.   Sessions: 10     Goal 4: Improve Physical FS Primary Measure from 65 to 75 or better to demonstrate change for significant functional improvement.      Sessions: 10                                                Rosanne SackLauren A Risa Auman, DPT

## 2022-01-27 ENCOUNTER — Inpatient Hospital Stay: Payer: Medicare Other | Admitting: Rehabilitative and Restorative Service Providers"

## 2022-01-27 DIAGNOSIS — M542 Cervicalgia: Secondary | ICD-10-CM

## 2022-01-27 DIAGNOSIS — R42 Dizziness and giddiness: Secondary | ICD-10-CM

## 2022-01-27 DIAGNOSIS — R269 Unspecified abnormalities of gait and mobility: Secondary | ICD-10-CM

## 2022-01-27 NOTE — PT/OT Therapy Note (Signed)
Name: Kinzly Pierrelouis Age: 76 y.o.   Date of Service: 01/27/2022  Referring Physician: Bertram Gala,*   Date of Injury: 11/22/2021  Date Care Plan Established/Reviewed: 01/22/2022  Date Treatment Started: 01/22/2022  Date Care Plan Established/Reviewed No data was found  Date Treatment Started No data was found  (Historic) Date Care Plan Established/Reviewed No data was found  (Historic) Date Treatment Started No data was found   End of Certification Date: 04/21/2022  Sessions in Plan of Care: 10  Surgery Date: No data was found  MD Follow-up: No data was found  Medbridge Code: No data was found    Visit Count: 2   Diagnosis:   1. Dizziness and giddiness    2. Abnormality of gait and mobility    3. Neck pain        Subjective     Daily Subjective   Pt reports her normal dizziness today.    Did the HEP no problems.  Reports that she has Left knee pain with h/o ACL tear,sometimes she moves off her left leg due to that pain.    Social Support/Occupation              Occupation: retired      Precautions: No data was found  Allergies: Amoxicillin    Objective                        Outcome Measure:  Outcomes:    Initial             Primary Functional Status Measure 65       PSFS:         Pain last 24 hrs         Least pain last 30 days         Greatest pain last 30 days                      Living Environment:    Dwelling Entrance:   Patient lives with:       OBJECTIVE:  Vitals:           Observation/Posture/Gait/Integumentary  Posture: Deficits noted: Forward Head and Other: bent forward art hips, knees slightly bent, wide BOS, elevated shoulder with right more than left  Gait: decreased heel strike bilateral, decreased hip extension bilateral, and decreased TKE bilateral              Gait Speed: slow  Integumentary: No wound, lesion or rash noted     Musculoskeletal Screen:  Cervical AROM  Initial            Flexion 42           Extension 21           Side Bend R 10           Side Bend L 20           Rotation R  30           Rotation L 41           (blank fields were intentionally left blank)        Special Tests/Neurological Screen:  Vertebral Artery NT   Spontaneous Nystagmus (fixation present) (-)   Spontaneous Nystagmus (fixation suppressed) NT   Gaze-evoked Nystagmus (fixation present) (-)   Gaze-evoked Nystagmus (fixation suppressed) NT   Smooth Pursuit intact   Saccade Intact   VOR cancellation Intact but very slow  VOR Difficulty, symptoms   Convergence Inatct   Whisper/rubbing test NT   Weber Test NT   Rinne Test NT        01/22/22  Modified CTSIB: Steady with EO on ground, unsteady on airex, pt fell forward to the left with EC on airex with increased symptoms noted    Treatment     Therapeutic Exercises - Justified to address any of the following:  To develop strength, endurance, ROM and/or flexibility.   Pt education on back brace and how it may not help strengthen the muscles     Sit to stand x 15 with cueing to avoid lateral leaning to the right, using hip hinge and correct weight shifting    Shoulder ER with RTB with cervical rotations x10 bilat to increase scapular stabilization with head turns     Neuromuscular Re-Education - Justified to address any of the following:   Re-education of movement, balance, coordination, kinesthetic sense, posture and/or proprioception for sitting and/or standing activities.   Standing on airex  -weight shifting  -EC standing x3      Standing on the ground   -tandem stance 10" x2 bilat  -tandem stance 10" bilat  -narrow stance EC 10" x3      Initiated core stabilization training to address slouched posture position:  PT ed with feedback of TA activation for 2' with engagement and breathing  TA brace with march  and LAQ 1' ea to retrain core activation with large muscle groups contracting at the same time         Home Exercises   Access Code: [email or web address]  URL: [email or web address]  Date: 01/22/2022  Prepared by: Mervyn Gay     Exercises  - Seated Gaze Stabilization  with Head Rotation  - 1 x daily - 7 x weekly - 1 sets - 10 reps  - Seated Gaze Stabilization with Head Nod  - 1 x daily - 7 x weekly - 1 sets - 10 reps  - Standing Backward Shoulder Rolls  - 2 x daily - 7 x weekly - 1 sets - 10 reps - 1 hold  - Seated Scapular Retraction  - 2 x daily - 7 x weekly - 1 sets - 10 reps - 5 hold  - Supine Cervical Rotation AROM on Pillow  - 2 x daily - 7 x weekly - 1 sets - 10 reps - 5 hold          ---      Flowsheet Row ---   Total Time    Timed Minutes 40 minutes   Total Time 40 minutes          Assessment   Difficulty with right foot placement tandem.  Noted that pt goes into flexed position with balancing, added core stabilization training to help pt maitian correct posture and improve overall balance ability.  Increased stability report from pt with sit or stand.  Increased cueing need for improve posture with balance and avoid flexed forward posture.    Progress towards functional goals: HEP initiated    Patient requires continued skilled care to: be able to walk safely without limitations or falls  Plan   Continue with POC      Goals      Goal 1: Patient will demonstrate independence in prescribed HEP with proper form, sets and reps for safe discharge to an independent program.   Sessions: 10     Goal 2: Pt will be able to perform VOR and  VOR cancellation tests without symptoms normally to be able to move her head quickly without increased symptoms   Sessions: 10     Goal 3: Increase balance to be able to stand on airex pad with EO for 30 sec without symptoms or LOB.   Sessions: 10     Goal 4: Improve Physical FS Primary Measure from 65 to 75 or better to demonstrate change for significant functional improvement.      Sessions: 10                                                Rosanne Sack, DPT

## 2022-02-04 ENCOUNTER — Inpatient Hospital Stay: Payer: Medicare Other

## 2022-02-04 DIAGNOSIS — M542 Cervicalgia: Secondary | ICD-10-CM

## 2022-02-04 DIAGNOSIS — R42 Dizziness and giddiness: Secondary | ICD-10-CM

## 2022-02-04 DIAGNOSIS — R269 Unspecified abnormalities of gait and mobility: Secondary | ICD-10-CM

## 2022-02-04 NOTE — PT/OT Therapy Note (Signed)
Name: Brenda Stevens Age: 76 y.o.   Date of Service: 02/04/2022  Referring Physician: Bertram Gala,*   Date of Injury: 11/22/2021  Date Care Plan Established/Reviewed: 01/22/2022  Date Treatment Started: 01/22/2022  Date Care Plan Established/Reviewed No data was found  Date Treatment Started No data was found  (Historic) Date Care Plan Established/Reviewed No data was found  (Historic) Date Treatment Started No data was found   End of Certification Date: 04/21/2022  Sessions in Plan of Care: 10  Surgery Date: No data was found  MD Follow-up: No data was found  Medbridge Code: No data was found    Visit Count: 3   Diagnosis:   1. Dizziness and giddiness    2. Abnormality of gait and mobility    3. Neck pain        Subjective     Daily Subjective   The exercises are definitely helping my dizziness. Continue to feel imbalance with standing demands    Social Support/Occupation              Occupation: retired      Precautions: No data was found  Allergies: Amoxicillin    Objective                        Outcome Measure:  Outcomes:    Initial             Primary Functional Status Measure 65       PSFS:         Pain last 24 hrs         Least pain last 30 days         Greatest pain last 30 days                      Living Environment:    Dwelling Entrance:   Patient lives with:       OBJECTIVE:  Vitals:           Observation/Posture/Gait/Integumentary  Posture: Deficits noted: Forward Head and Other: bent forward art hips, knees slightly bent, wide BOS, elevated shoulder with right more than left  Gait: decreased heel strike bilateral, decreased hip extension bilateral, and decreased TKE bilateral              Gait Speed: slow  Integumentary: No wound, lesion or rash noted     Musculoskeletal Screen:  Cervical AROM  Initial            Flexion 42           Extension 21           Side Bend R 10           Side Bend L 20           Rotation R 30           Rotation L 41           (blank fields were intentionally left  blank)        Special Tests/Neurological Screen:  Vertebral Artery NT   Spontaneous Nystagmus (fixation present) (-)   Spontaneous Nystagmus (fixation suppressed) NT   Gaze-evoked Nystagmus (fixation present) (-)   Gaze-evoked Nystagmus (fixation suppressed) NT   Smooth Pursuit intact   Saccade Intact   VOR cancellation Intact but very slow   VOR Difficulty, symptoms   Convergence Inatct   Whisper/rubbing test NT   Weber Test NT   Rinne Test  NT        01/22/22  Modified CTSIB: Steady with EO on ground, unsteady on airex, pt fell forward to the left with EC on airex with increased symptoms noted    Treatment     Therapeutic Exercises - Justified to address any of the following:  To develop strength, endurance, ROM and/or flexibility.   Seated core series 5" 2x5 B  Standing samurai with UE push into table 5" 2x5       Neuromuscular Re-Education - Justified to address any of the following:   Re-education of movement, balance, coordination, kinesthetic sense, posture and/or proprioception for sitting and/or standing activities.   mCTSIB: 1: 20 sec 2: 20 sec increased sway 4/10 dizziness 3: 20 sec 4: 3 sec  Standing feet together foam VOR x1 horiz/vert  Standing feet together foam EO/EC  Tandem stance 2x20 sec B VC for core stabilization  marching to SLS hold 5" x5 B      Therapeutic Activity - Justified to address the following:  Dynamic activities to improve functional performance.  Justification: To allow patient to assume and maintain a neutral standing postural alignment. Patient kinesthetically compared weight distribution in feet effects alignment of structures above. Therapist identified patient's neutral standing posture. Therapist trained patient in how to identify when they are standing in a neutral posture. Patient taken through standing postural correction by therapist to find neutral standing posture alignment. Therapist taught patient to relax abdomen into pelvis and thoracic cage onto abdomen to find  neutral, relaxed standing posture.      Home Exercises   Access Code: [email or web address]  URL: [email or web address]  Date: 02/04/2022  Prepared by: Jeni Salles    Exercises  - Seated Gaze Stabilization with Head Rotation  - 1 x daily - 7 x weekly - 1 sets - 10 reps  - Seated Gaze Stabilization with Head Nod  - 1 x daily - 7 x weekly - 1 sets - 10 reps  - Standing Backward Shoulder Rolls  - 2 x daily - 7 x weekly - 1 sets - 10 reps - 1 hold  - Seated Scapular Retraction  - 2 x daily - 7 x weekly - 1 sets - 10 reps - 5 hold  - Supine Cervical Rotation AROM on Pillow  - 2 x daily - 7 x weekly - 1 sets - 10 reps - 5 hold  - Standing Hip Flexion March  - 2 x daily - 7 x weekly - 3 sets - 10 reps - 5 hold  - Single leg ab series/brace  - 2 x daily - 7 x weekly - 2 sets - 5 reps - 5 hold          ---      Flowsheet Row ---   Total Time    Timed Minutes 45 minutes   Total Time 45 minutes          Assessment   Utilized motor control training with facilitation of core first strategies to improve standing postural alignment and reduce sway with feet together and narrow base of support. Pt educated on importance of base of support in facilitation of balance training. Pt able to perform SLS 5 sec B LE , tandem stance 20 sec without loss of balance and feet together eyes closed 20 sec without loss of balance at end of session with repeated exposure and postural stabilization.    Patient requires continued skilled care to: be able to walk safely without limitations or falls  Plan   Continue with POC      Goals      Goal 1: Patient will demonstrate independence in prescribed HEP with proper form, sets and reps for safe discharge to an independent program.   Sessions: 10     Goal 2: Pt will be able to perform VOR and VOR cancellation tests without symptoms normally to be able to move her head quickly without increased symptoms   Sessions: 10     Goal 3: Increase balance to be able to stand on airex pad with EO for 30 sec  without symptoms or LOB.   Sessions: 10     Goal 4: Improve Physical FS Primary Measure from 65 to 75 or better to demonstrate change for significant functional improvement.      Sessions: 10                                                Jeni Salles, DPT

## 2022-02-11 ENCOUNTER — Inpatient Hospital Stay: Payer: Medicare Other

## 2022-02-11 DIAGNOSIS — R42 Dizziness and giddiness: Secondary | ICD-10-CM | POA: Insufficient documentation

## 2022-02-11 DIAGNOSIS — R269 Unspecified abnormalities of gait and mobility: Secondary | ICD-10-CM | POA: Insufficient documentation

## 2022-02-11 DIAGNOSIS — M542 Cervicalgia: Secondary | ICD-10-CM | POA: Insufficient documentation

## 2022-02-11 NOTE — PT/OT Therapy Note (Signed)
Name: Brenda Stevens Age: 76 y.o.   Date of Service: 02/11/2022  Referring Physician: Bertram Gala,*   Date of Injury: 11/22/2021  Date Care Plan Established/Reviewed: 01/22/2022  Date Treatment Started: 01/22/2022  Date Care Plan Established/Reviewed No data was found  Date Treatment Started No data was found  (Historic) Date Care Plan Established/Reviewed No data was found  (Historic) Date Treatment Started No data was found   End of Certification Date: 04/21/2022  Sessions in Plan of Care: 10  Surgery Date: No data was found  MD Follow-up: No data was found  Medbridge Code: No data was found    Visit Count: 4   Diagnosis:   1. Dizziness and giddiness    2. Abnormality of gait and mobility    3. Neck pain        Subjective     Daily Subjective   I feel like my legs are getting stronger I have had less imbalance with standing demands. I did have one episode of dizziness while I was frantically getting ready and I had to sit down to restore my balance.    Social Support/Occupation              Occupation: retired      Precautions: No data was found  Allergies: Amoxicillin    Objective                        Outcome Measure:  Outcomes:    Initial             Primary Functional Status Measure 65       PSFS:         Pain last 24 hrs         Least pain last 30 days         Greatest pain last 30 days                      Living Environment:    Dwelling Entrance:   Patient lives with:       OBJECTIVE:  Vitals:           Observation/Posture/Gait/Integumentary  Posture: Deficits noted: Forward Head and Other: bent forward art hips, knees slightly bent, wide BOS, elevated shoulder with right more than left  Gait: decreased heel strike bilateral, decreased hip extension bilateral, and decreased TKE bilateral              Gait Speed: slow  Integumentary: No wound, lesion or rash noted     Musculoskeletal Screen:  Cervical AROM  Initial            Flexion 42           Extension 21           Side Bend R 10           Side  Bend L 20           Rotation R 30           Rotation L 41           (blank fields were intentionally left blank)        Special Tests/Neurological Screen:  Vertebral Artery NT   Spontaneous Nystagmus (fixation present) (-)   Spontaneous Nystagmus (fixation suppressed) NT   Gaze-evoked Nystagmus (fixation present) (-)   Gaze-evoked Nystagmus (fixation suppressed) NT   Smooth Pursuit intact   Saccade Intact   VOR cancellation Intact but  very slow   VOR Difficulty, symptoms   Convergence Inatct   Whisper/rubbing test NT   Weber Test NT   Rinne Test NT        01/22/22  Modified CTSIB: Steady with EO on ground, unsteady on airex, pt fell forward to the left with EC on airex with increased symptoms noted  67/23 SLS 3 sec R LE 5 sec L LE    Treatment     Therapeutic Exercises - Justified to address any of the following:  To develop strength, endurance, ROM and/or flexibility.   Standing hip swings 2 x10 B with 1 UE support  Standing vectors 2x5 B without UE support      Neuromuscular Re-Education - Justified to address any of the following:   Re-education of movement, balance, coordination, kinesthetic sense, posture and/or proprioception for sitting and/or standing activities.   mCTSIB: 1: 20 sec 2: 20 sec  3: 20 sec 4: 20 sec increased sway  Standing feet together foam VOR x1 horiz/vert  Standing feet together foam EO/EC  Tandem stance 2x20 sec B VC for core stabilization  marching to SLS hold 5" x5 B  Walking with head turns 4x30 ft horizontal and vertical  Walking with VORx1 4x30 ft horizontal and vertical  Tilt board horizontal B UE support progressed to no UE support x1 min  Tilt board vertical B UE support progressed to no UE support x1 min  Cone pickups with gaze stabilization        Home Exercises   Access Code: [email or web address]  URL: [email or web address]  Date: 02/11/2022  Prepared by: Jeni Salles    Exercises  - Seated Gaze Stabilization with Head Rotation  - 1 x daily - 7 x weekly - 1 sets - 10 reps  -  Seated Gaze Stabilization with Head Nod  - 1 x daily - 7 x weekly - 1 sets - 10 reps  - Standing Backward Shoulder Rolls  - 2 x daily - 7 x weekly - 1 sets - 10 reps - 1 hold  - Seated Scapular Retraction  - 2 x daily - 7 x weekly - 1 sets - 10 reps - 5 hold  - Supine Cervical Rotation AROM on Pillow  - 2 x daily - 7 x weekly - 1 sets - 10 reps - 5 hold  - Single leg ab series/brace  - 2 x daily - 7 x weekly - 2 sets - 5 reps - 5 hold  - Hip Swing  - 2 x daily - 7 x weekly - 2 sets - 10 reps  - Standing Tandem Balance with Counter Support  - 2 x daily - 7 x weekly - 3 sets - 20 hold       ---      Flowsheet Row ---   Total Time    Timed Minutes 44 minutes   Total Time 44 minutes            Assessment   Pt displays improvement in static and dynamic balance with decreased postural sway. mCTSIB is WNL without reproduction in dizziness. Pt reports increased lightheadedness/ fogginess after walking with head turns but did not have alterations in gait or speed. Pt will continue to benefit from skilled PT to restore balance in narrow base of support in order to reduce risk of falls with ambulation.   Plan   Continue with POC      Goals      Goal 1: Patient will demonstrate independence  in prescribed HEP with proper form, sets and reps for safe discharge to an independent program.  02/11/22 goal in progress   Sessions: 10     Goal 2: Pt will be able to perform VOR and VOR cancellation tests without symptoms normally to be able to move her head quickly without increased symptoms  02/11/22 goal in progress VOR without dizziness, continues with dizziness walking VOR    Sessions: 10     Goal 3: Increase balance to be able to stand on airex pad with EO for 30 sec without symptoms or LOB.  02/11/22 goal not met, SLS R 3 sec L 5 sec firm ground   Sessions: 10     Goal 4: Improve Physical FS Primary Measure from 65 to 75 or better to demonstrate change for significant functional improvement.      Sessions: 10                                                 Jeni Salles, DPT

## 2022-02-18 ENCOUNTER — Inpatient Hospital Stay: Payer: Medicare Other

## 2022-02-18 DIAGNOSIS — M542 Cervicalgia: Secondary | ICD-10-CM

## 2022-02-18 DIAGNOSIS — R42 Dizziness and giddiness: Secondary | ICD-10-CM

## 2022-02-18 DIAGNOSIS — R269 Unspecified abnormalities of gait and mobility: Secondary | ICD-10-CM

## 2022-02-18 NOTE — PT/OT Therapy Note (Signed)
Name: Brenda Stevens Age: 76 y.o.   Date of Service: 02/18/2022  Referring Physician: Bertram Gala,*   Date of Injury: 11/22/2021  Date Care Plan Established/Reviewed: 01/22/2022  Date Treatment Started: 01/22/2022  Date Care Plan Established/Reviewed No data was found  Date Treatment Started No data was found  (Historic) Date Care Plan Established/Reviewed No data was found  (Historic) Date Treatment Started No data was found   End of Certification Date: 04/21/2022  Sessions in Plan of Care: 10  Surgery Date: No data was found  MD Follow-up: No data was found  Medbridge Code: No data was found    Visit Count: 5   Diagnosis:   1. Dizziness and giddiness    2. Abnormality of gait and mobility    3. Neck pain        Subjective     Daily Subjective   Dizziness comes and goes with some days better than others. Pt continues with anxiety with walking, stepping over items, up and down stairs with caution. Fear of fall is still relevant. I have improved my ability to clean up the kitchen with less dizziness    Social Support/Occupation              Occupation: retired      Precautions: No data was found  Allergies: Amoxicillin    Objective                        Outcome Measure:  Outcomes:    Initial             Primary Functional Status Measure 65       PSFS:         Pain last 24 hrs         Least pain last 30 days         Greatest pain last 30 days                      Living Environment:    Dwelling Entrance:   Patient lives with:       OBJECTIVE:  Vitals:           Observation/Posture/Gait/Integumentary  Posture: Deficits noted: Forward Head and Other: bent forward art hips, knees slightly bent, wide BOS, elevated shoulder with right more than left  Gait: decreased heel strike bilateral, decreased hip extension bilateral, and decreased TKE bilateral              Gait Speed: slow  Integumentary: No wound, lesion or rash noted     Musculoskeletal Screen:  Cervical AROM  Initial            Flexion 42            Extension 21           Side Bend R 10           Side Bend L 20           Rotation R 30           Rotation L 41           (blank fields were intentionally left blank)        Special Tests/Neurological Screen:  Vertebral Artery NT   Spontaneous Nystagmus (fixation present) (-)   Spontaneous Nystagmus (fixation suppressed) NT   Gaze-evoked Nystagmus (fixation present) (-)   Gaze-evoked Nystagmus (fixation suppressed) NT   Smooth Pursuit intact   Saccade Intact  VOR cancellation Intact but very slow   VOR Difficulty, symptoms   Convergence Inatct   Whisper/rubbing test NT   Weber Test NT   Rinne Test NT        01/22/22  Modified CTSIB: Steady with EO on ground, unsteady on airex, pt fell forward to the left with EC on airex with increased symptoms noted  67/23 SLS 3 sec R LE 5 sec L LE    Treatment     Therapeutic Exercises - Justified to address any of the following:  To develop strength, endurance, ROM and/or flexibility.   Standing hip swings 2 x10 B with 1 UE support  Standing vectors 2x5 B without UE support      Neuromuscular Re-Education - Justified to address any of the following:   Re-education of movement, balance, coordination, kinesthetic sense, posture and/or proprioception for sitting and/or standing activities.   3 sets of occulomotor exercises:    Seated smooth pursuit 2x30 sec vertical and horizontal verbal cues to decrease speed due to increased corrective saccades dizziness increased to 4/10  Seated VORx1 2x30 sec vertical and horizontal dizziness increased to 5/10   Seated VOR cancellation 2x30 sec horizontally dizziness increased to 6/10       -R dix hallpike no nystagmus  - L dix hallpike no nystagmus 4/10 dizziness  -roll test B     Home Exercises   Access Code: [email or web address]  URL: [email or web address]  Date: 02/11/2022  Prepared by: Jeni Salles    Exercises  - Seated Gaze Stabilization with Head Rotation  - 1 x daily - 7 x weekly - 1 sets - 10 reps  - Seated Gaze Stabilization with  Head Nod  - 1 x daily - 7 x weekly - 1 sets - 10 reps  - Standing Backward Shoulder Rolls  - 2 x daily - 7 x weekly - 1 sets - 10 reps - 1 hold  - Seated Scapular Retraction  - 2 x daily - 7 x weekly - 1 sets - 10 reps - 5 hold  - Supine Cervical Rotation AROM on Pillow  - 2 x daily - 7 x weekly - 1 sets - 10 reps - 5 hold  - Single leg ab series/brace  - 2 x daily - 7 x weekly - 2 sets - 5 reps - 5 hold  - Hip Swing  - 2 x daily - 7 x weekly - 2 sets - 10 reps  - Standing Tandem Balance with Counter Support  - 2 x daily - 7 x weekly - 3 sets - 20 hold       ---      Flowsheet Row ---   Total Time    Timed Minutes 45 minutes   Total Time 45 minutes              Assessment   Overshooting moving eyes from Left to right smooth pursuit and VOR cancellation. Occulomotor exercises increased dizziness to 4/10 with recovery time 2 minutes to return to baseline 0/10. Utilized video to educate patient on smooth persuit dysfunction to encourage improvement in form of exercises to avoid corrective saccades. Pt demonstrates improved awareness and bale to track with improved quality of motion following education. Updated HEP to facilitate improve occulomootor stabilization.   Plan   Continue with POC, reassess occulomotor screen      Goals      Goal 1: Patient will demonstrate independence in prescribed HEP with proper form, sets and reps for  safe discharge to an independent program.  02/11/22 goal in progress   Sessions: 10     Goal 2: Pt will be able to perform VOR and VOR cancellation tests without symptoms normally to be able to move her head quickly without increased symptoms  02/11/22 goal in progress VOR without dizziness, continues with dizziness walking VOR    Sessions: 10     Goal 3: Increase balance to be able to stand on airex pad with EO for 30 sec without symptoms or LOB.  02/11/22 goal not met, SLS R 3 sec L 5 sec firm ground   Sessions: 10     Goal 4: Improve Physical FS Primary Measure from 65 to 75 or better to  demonstrate change for significant functional improvement.      Sessions: 10                                                Jeni Salles, DPT

## 2022-02-25 ENCOUNTER — Inpatient Hospital Stay: Payer: Medicare Other

## 2022-02-25 DIAGNOSIS — M542 Cervicalgia: Secondary | ICD-10-CM

## 2022-02-25 DIAGNOSIS — R42 Dizziness and giddiness: Secondary | ICD-10-CM

## 2022-02-25 DIAGNOSIS — R269 Unspecified abnormalities of gait and mobility: Secondary | ICD-10-CM

## 2022-03-04 ENCOUNTER — Inpatient Hospital Stay: Payer: Medicare Other

## 2022-04-16 NOTE — Progress Notes (Signed)
Name:Starlina Trevino Age: 76 y.o.   Date of Service: 02/25/2022  Referring Physician: Bertram Gala,*   Date of Injury: 11/22/2021  Date Care Plan Established/Reviewed: 01/22/2022  Date Treatment Started: 01/22/2022  Date Care Plan Established/Reviewed No data was found  Date Treatment Started No data was found  (Historic) Date Care Plan Established/Reviewed No data was found  (Historic) Date Treatment Started No data was found   End of Certification Date: 04/21/2022  Sessions in Plan of Care: 10  Surgery Date: No data was found  MD Follow-up: No data was found  Medbridge Code: No data was found    Visit Count: 6   Diagnosis:    Diagnosis ICD-10-CM Associated Order   1. Dizziness and giddiness  R42       2. Abnormality of gait and mobility  R26.9       3. Neck pain  M54.2           Subjective     Social Support/Occupation              Occupation: retired      Precautions: No data was found  Allergies: Amoxicillin    Past Medical History:   Diagnosis Date    Heart valve replaced        Objective                           ---      Flowsheet Row ---   Total Time    Timed Minutes 45 minutes   Total Time 45 minutes                 Jeni Salles, DPT

## 2022-04-16 NOTE — Progress Notes (Signed)
Discontinuation of Therapy Services    Paislee Ullery did not return for additional Physical therapy sessions.    Status is unknown at this time, physical therapy has been discontinued and patient has been discharged from care.  The last therapy note is below for review.    Please feel free to contact me with any questions regarding the care of Brenda Stevens.    Sincerely,    Jeni Salles, DPT        04/16/2022    Name: Roberts Gaudy Age: 76 y.o.   Date of Service: 02/25/2022  Referring Physician: Bertram Gala,*   Date of Injury: 11/22/2021  Date Care Plan Established/Reviewed: 01/22/2022  Date Treatment Started: 01/22/2022  Date Care Plan Established/Reviewed No data was found  Date Treatment Started No data was found  (Historic) Date Care Plan Established/Reviewed No data was found  (Historic) Date Treatment Started No data was found   End of Certification Date: 04/21/2022  Sessions in Plan of Care: 10  Surgery Date: No data was found  MD Follow-up: No data was found  Medbridge Code: No data was found     Visit Count: 6   Diagnosis:   1. Dizziness and giddiness    2. Abnormality of gait and mobility    3. Neck pain          Subjective      Daily Subjective   I still have increased dizziness while doing exercises and more fatigue after them I have dizziness while cleaning house rapidly. I have an opthalmologic appointment scheduled.     Social Support/Occupation                 Occupation: retired        Precautions: No data was found  Allergies: Amoxicillin     Objective                         Outcome Measure:  Outcomes:    Initial             Primary Functional Status Measure 65       PSFS:         Pain last 24 hrs         Least pain last 30 days         Greatest pain last 30 days                      Living Environment:    Dwelling Entrance:   Patient lives with:       OBJECTIVE:  Vitals:           Observation/Posture/Gait/Integumentary  Posture: Deficits noted: Forward Head and Other: bent  forward art hips, knees slightly bent, wide BOS, elevated shoulder with right more than left  Gait: decreased heel strike bilateral, decreased hip extension bilateral, and decreased TKE bilateral              Gait Speed: slow  Integumentary: No wound, lesion or rash noted     Musculoskeletal Screen:  Cervical AROM  Initial            Flexion 42           Extension 21           Side Bend R 10           Side Bend L 20           Rotation R 30  Rotation L 41           (blank fields were intentionally left blank)        Special Tests/Neurological Screen:  Vertebral Artery NT   Spontaneous Nystagmus (fixation present) (-)   Spontaneous Nystagmus (fixation suppressed) NT   Gaze-evoked Nystagmus (fixation present) (-)   Gaze-evoked Nystagmus (fixation suppressed) NT   Smooth Pursuit intact   Saccade Intact   VOR cancellation Intact but very slow   VOR Difficulty, symptoms   Convergence Inatct   Whisper/rubbing test NT   Weber Test NT   Rinne Test NT         01/22/22  Modified CTSIB: Steady with EO on ground, unsteady on airex, pt fell forward to the left with EC on airex with increased symptoms noted  67/23 SLS 3 sec R LE 5 sec L LE     Treatment      Neuromuscular Re-Education - Justified to address any of the following:   Re-education of movement, balance, coordination, kinesthetic sense, posture and/or proprioception for sitting and/or standing activities.   3 sets of occulomotor exercises:     Seated smooth pursuit 2x30 sec vertical and horizontal verbal cues to decrease speed due to increased corrective saccades dizziness increased to 4/10  Seated VORx2 2x30 sec vertical and horizontal dizziness increased to 5/10   Seated VOR cancellation 2x30 sec horizontally dizziness increased to 6/10      mCTSIB x3 20 sec each     Home Exercises   Access Code: [email or web address]  URL: [email or web address]  Date: 02/11/2022  Prepared by: Jeni Salles     Exercises  - Seated Gaze Stabilization with Head Rotation  - 1  x daily - 7 x weekly - 1 sets - 10 reps  - Seated Gaze Stabilization with Head Nod  - 1 x daily - 7 x weekly - 1 sets - 10 reps  - Standing Backward Shoulder Rolls  - 2 x daily - 7 x weekly - 1 sets - 10 reps - 1 hold  - Seated Scapular Retraction  - 2 x daily - 7 x weekly - 1 sets - 10 reps - 5 hold  - Supine Cervical Rotation AROM on Pillow  - 2 x daily - 7 x weekly - 1 sets - 10 reps - 5 hold  - Single leg ab series/brace  - 2 x daily - 7 x weekly - 2 sets - 5 reps - 5 hold  - Hip Swing  - 2 x daily - 7 x weekly - 2 sets - 10 reps  - Standing Tandem Balance with Counter Support  - 2 x daily - 7 x weekly - 3 sets - 20 hold     ---       Flowsheet Row ---   Total Time     Timed Minutes 45 minutes   Total Time 45 minutes                   Assessment   Overshooting moving eyes from Left to right smooth pursuit dizziness increased 5/10. Educated on exposture and recoevery with improved occulomotor tracking without jumping of eyes to improve stabilization. Pt displays normal postural stabilization without increased sway despite internal sensation of imbalance. Pt will continue to benefit from occulomotor stabilization to reduce dizziness and restore functional mobility.   Plan   Continue occulomotor tracking        Goals       Goal 1: Patient will demonstrate  independence in prescribed HEP with proper form, sets and reps for safe discharge to an independent program.  02/11/22 goal in progress   Sessions: 10      Goal 2: Pt will be able to perform VOR and VOR cancellation tests without symptoms normally to be able to move her head quickly without increased symptoms  02/11/22 goal in progress VOR without dizziness, continues with dizziness walking VOR    Sessions: 10      Goal 3: Increase balance to be able to stand on airex pad with EO for 30 sec without symptoms or LOB.  02/11/22 goal not met, SLS R 3 sec L 5 sec firm ground   Sessions: 10      Goal 4: Improve Physical FS Primary Measure from 65 to 75 or better to  demonstrate change for significant functional improvement.       Sessions: 10                                                       Jeni SallesStephanie Aleister Lady, DPT

## 2022-04-21 ENCOUNTER — Encounter (INDEPENDENT_AMBULATORY_CARE_PROVIDER_SITE_OTHER): Payer: Self-pay | Admitting: Family Nurse Practitioner

## 2022-04-21 ENCOUNTER — Ambulatory Visit (INDEPENDENT_AMBULATORY_CARE_PROVIDER_SITE_OTHER): Payer: Medicare Other | Admitting: Family Nurse Practitioner

## 2022-04-21 VITALS — BP 109/68 | HR 73 | Temp 96.0°F | Resp 20 | Ht 63.0 in | Wt 163.0 lb

## 2022-04-21 DIAGNOSIS — L02212 Cutaneous abscess of back [any part, except buttock]: Secondary | ICD-10-CM

## 2022-04-21 MED ORDER — CEPHALEXIN 500 MG PO CAPS
500.0000 mg | ORAL_CAPSULE | Freq: Two times a day (BID) | ORAL | 0 refills | Status: AC
Start: 2022-04-21 — End: 2022-04-28

## 2022-04-21 NOTE — Patient Instructions (Addendum)
Start antibiotics.   Do warm warm compress for 15-20 min 2-4 times a day to encourage drainage.  Do not squeeze it.   If it drains, continue warm water or do warm compress until it stops draining.    Wash with soap and water.    Keep loosely covered if draining   Understands that if not better, may need to return or have incision and drainage.     Follow up with your doctor in 3 days to recheck, sooner if not better. If unable to see your doctor, you may return.

## 2022-04-21 NOTE — Progress Notes (Signed)
[facility]  URGENT  CARE  PROGRESS NOTE     Patient: Brenda Stevens   Date: 04/21/2022   MRN: [id number]       Brenda Stevens is a 76 y.o. female      HISTORY     History obtained from: Patient    Chief Complaint   Patient presents with    Mass     Pt c/o bump at the middle of her back onset for couple of weeks. Pt states that it hurts to lean on her back. She feels the pressure on her back.        HPI Brenda Stevens is a 76 y.o. F with pmhx of HTN, HLD, and aortic valve replacement who presents with concern for bump to the middle of her back that has been there for the couple weeks. Denies drainage. Has been painful for the past 1.5 weeks.     Review of Systems    History:    Pertinent Past Medical, Surgical, Family and Social History were reviewed.        Current Outpatient Medications:     amLODIPine (NORVASC) 5 MG tablet, , Disp: , Rfl:     aspirin 325 MG tablet, every 24 hours, Disp: , Rfl:     atorvastatin (LIPITOR) 40 MG tablet, atorvastatin 40 mg tablet, Disp: , Rfl:     clindamycin (CLEOCIN) 300 MG capsule, clindamycin HCl 300 mg capsule, Disp: , Rfl:     hydroCHLOROthiazide (HYDRODIURIL) 25 MG tablet, , Disp: , Rfl:     irbesartan (AVAPRO) 150 MG tablet, , Disp: , Rfl:     ondansetron (ZOFRAN-ODT) 4 MG disintegrating tablet, Take 1 tablet (4 mg) by mouth every 6 (six) hours as needed for Nausea, Disp: 8 tablet, Rfl: 0    cephALEXin (KEFLEX) 500 MG capsule, Take 1 capsule (500 mg) by mouth 2 (two) times daily for 7 days, Disp: 14 capsule, Rfl: 0    metoprolol succinate XL (TOPROL-XL) 50 MG 24 hr tablet, Take 0.5 tablets (25 mg) by mouth daily, Disp: , Rfl:     metoprolol tartrate (LOPRESSOR) 25 MG tablet, metoprolol tartrate 25 mg tablet (Patient not taking: Reported on 04/21/2022), Disp: , Rfl:     Allergies   Allergen Reactions    Amoxicillin Nausea And Vomiting       Medications and Allergies reviewed.    PHYSICAL EXAM     Vitals:    04/21/22 1107   BP: 109/68   Pulse: 73   Resp: 20   Temp: (!) 96 F  (35.6 C)   SpO2: 96%   Weight: 73.9 kg (163 lb)   Height: 1.6 m (5\' 3" )       Physical Exam  Skin:     Comments: 2x2 cm circular abscess to mid upper back.         UCC COURSE     There were no labs reviewed with this patient during the visit.    There were no x-rays reviewed with this patient during the visit.    No current facility-administered medications for this visit.       PROCEDURES     Incision and Drainage    Date/Time: 04/21/2022 11:28 AM    Performed by: Kyra Manges, FNP  Authorized by: Loretta Plume, MD    Consent:     Consent obtained:  Verbal    Consent given by:  Patient    Risks discussed:  Infection and pain  Type:  Abscess  Body area:  Trunk  Location details:  Back  Needle gauge:  18  Incision depth:  Dermal  Complexity:  Simple  Drainage:  Bloody  Drainage amount:  Scant  Wound treatment:  Wound left open  Packing material:  None  Patient tolerance:  Patient tolerated the procedure well with no immediate complications      MEDICAL DECISION MAKING     History, physical, labs/studies most consistent with cutaneous abscess as the diagnosis.    Chart Review:  Prior PCP, Specialist and/or ED notes reviewed today: Not Applicable  Prior labs/images/studies reviewed today: Not Applicable    Differential Diagnosis: abscess, cellulitis, bug bite    ASSESSMENT     Encounter Diagnosis   Name Primary?    Cutaneous abscess of back excluding buttocks Yes                PLAN      PLAN: Rx Keflex; warm compresses            Orders Placed This Encounter   Procedures    Incision and Drainage     Requested Prescriptions     Signed Prescriptions Disp Refills    cephALEXin (KEFLEX) 500 MG capsule 14 capsule 0     Sig: Take 1 capsule (500 mg) by mouth 2 (two) times daily for 7 days       Discussed results and diagnosis with patient/family.  Reviewed warning signs for worsening condition, as well as, indications for follow-up with primary care physician and return to urgent care clinic.   Patient/family  expressed understanding of instructions.     An After Visit Summary was provided to the patient.    All relevant and clinical information was transcribed by me, Margo Common,  acting as a scribe for Leward Quan, NP

## 2022-05-02 ENCOUNTER — Encounter (INDEPENDENT_AMBULATORY_CARE_PROVIDER_SITE_OTHER): Payer: Self-pay

## 2022-05-02 ENCOUNTER — Ambulatory Visit (FREE_STANDING_LABORATORY_FACILITY): Payer: Medicare Other | Admitting: Family Nurse Practitioner

## 2022-05-02 VITALS — BP 128/74 | HR 73 | Temp 98.1°F | Resp 16 | Ht 63.0 in | Wt 165.0 lb

## 2022-05-02 DIAGNOSIS — L02212 Cutaneous abscess of back [any part, except buttock]: Secondary | ICD-10-CM

## 2022-05-02 MED ORDER — DOXYCYCLINE HYCLATE 100 MG PO TABS
100.0000 mg | ORAL_TABLET | Freq: Two times a day (BID) | ORAL | 0 refills | Status: AC
Start: 2022-05-02 — End: 2022-05-09

## 2022-05-02 NOTE — Progress Notes (Unsigned)
[facility]  URGENT  CARE  PROGRESS NOTE     Patient: Brenda Stevens   Date: 05/02/2022   MRN: [id number]       Brenda Stevens is a 76 y.o. female      HISTORY     History obtained from: Patient    Chief Complaint   Patient presents with    Follow-up     She had lump on her back. She was here last week and took some antibiotic now its red and has discharge.           History provided by:  Patient and spouse  Language interpreter used: No      Patient arrived to the office today with infected lump on her back. Stated that she was seen here about a week ago and placed on antibiotics. Last antibiotic was yesterday. Feels like it is not getting better. No fever or chills. Positive for pain. Has used warm compresses to help it drain. No past wounds/abscesses.     Review of Systems   Skin:  Positive for wound.        Back abscess.    All other systems reviewed and are negative.      History:    Pertinent Past Medical, Surgical, Family and Social History were reviewed.        Current Outpatient Medications:     amLODIPine (NORVASC) 5 MG tablet, , Disp: , Rfl:     aspirin 325 MG tablet, every 24 hours, Disp: , Rfl:     atorvastatin (LIPITOR) 40 MG tablet, atorvastatin 40 mg tablet, Disp: , Rfl:     hydroCHLOROthiazide (HYDRODIURIL) 25 MG tablet, , Disp: , Rfl:     irbesartan (AVAPRO) 150 MG tablet, , Disp: , Rfl:     metoprolol succinate XL (TOPROL-XL) 50 MG 24 hr tablet, Take 0.5 tablets (25 mg) by mouth daily, Disp: , Rfl:     metoprolol tartrate (LOPRESSOR) 25 MG tablet, , Disp: , Rfl:     ondansetron (ZOFRAN-ODT) 4 MG disintegrating tablet, Take 1 tablet (4 mg) by mouth every 6 (six) hours as needed for Nausea, Disp: 8 tablet, Rfl: 0    clindamycin (CLEOCIN) 300 MG capsule, clindamycin HCl 300 mg capsule, Disp: , Rfl:     doxycycline (VIBRA-TABS) 100 MG tablet, Take 1 tablet (100 mg) by mouth 2 (two) times daily for 7 days, Disp: 14 tablet, Rfl: 0    Allergies   Allergen Reactions    Amoxicillin Nausea And Vomiting        Medications and Allergies reviewed.    PHYSICAL EXAM     Vitals:    05/02/22 0934   BP: 128/74   Pulse: 73   Resp: 16   Temp: 98.1 F (36.7 C)   SpO2: 98%   Weight: 74.8 kg (165 lb)   Height: 1.6 m (5\' 3" )       Physical Exam  Constitutional:       Appearance: Normal appearance.   Musculoskeletal:        Back:       Comments: Inch wide abscess. Yellow/white pus draining from site.    Neurological:      Mental Status: She is oriented to person, place, and time.   Skin:     General: Skin is warm and dry.   Psychiatric:         Mood and Affect: Mood normal.         Behavior: Behavior normal.   Vitals and  nursing note reviewed.         UCC COURSE     There were no labs reviewed with this patient during the visit.    There were no x-rays reviewed with this patient during the visit.    No current facility-administered medications for this visit.       PROCEDURES     Incision and Drainage    Date/Time: 05/02/2022 10:00 AM    Performed by: Maryann Conners, FNP  Authorized by: Maryann Conners, FNP    Consent:     Consent obtained:  Verbal    Consent given by:  Patient    Risks discussed:  Infection and pain  Type:  Abscess  Body area:  Trunk (back)  Location details:  Back  Complexity:  Simple  Drainage:  Purulent  Drainage amount:  Moderate  Wound treatment:  Wound left open  Packing material:  None  Patient tolerance:  Patient tolerated the procedure well with no immediate complications   Encouraged patient to keep site clean and dry. Use Hibiclens daily for showers. Change dressing as needed.       MEDICAL DECISION MAKING     History, physical, labs/studies most consistent with back abscess as the diagnosis.    Chart Review:  Prior PCP, Specialist and/or ED notes reviewed today: No  Prior labs/images/studies reviewed today: No    Differential Diagnosis:   Lymphoma  Cyst    ASSESSMENT     Encounter Diagnosis   Name Primary?    Cutaneous abscess of back excluding buttocks Yes            PLAN      PLAN:   Discusses  concerns with patient.   Begin taking antibiotic.   Keep wound clean and dry.   Begin washing with Hibiclens daily.   Will notify you with culture results.   Follow up with general surgery for further management of abscess. Concern for tunneling.           Orders Placed This Encounter   Procedures    Incision and Drainage    Culture + Gram Stain,Aerobic, Wound    Ambulatory referral to General Surgery     Requested Prescriptions     Signed Prescriptions Disp Refills    doxycycline (VIBRA-TABS) 100 MG tablet 14 tablet 0     Sig: Take 1 tablet (100 mg) by mouth 2 (two) times daily for 7 days       Discussed results and diagnosis with patient/family.  Reviewed warning signs for worsening condition, as well as, indications for follow-up with primary care physician and return to urgent care clinic.   Patient/family expressed understanding of instructions.     An After Visit Summary was provided to the patient.

## 2022-05-02 NOTE — Patient Instructions (Signed)
Begin taking antibiotic.   Keep wound clean and dry.   Begin washing with Hibiclens daily.   Will notify you with culture results.   Follow up with general surgery.

## 2022-05-05 ENCOUNTER — Encounter (INDEPENDENT_AMBULATORY_CARE_PROVIDER_SITE_OTHER): Payer: Self-pay | Admitting: Family Nurse Practitioner

## 2022-05-05 ENCOUNTER — Telehealth (INDEPENDENT_AMBULATORY_CARE_PROVIDER_SITE_OTHER): Payer: Self-pay | Admitting: Family Nurse Practitioner

## 2022-05-05 NOTE — Telephone Encounter (Signed)
Called patient to discuss wound culture results. Patient reports that the abscess is feeling better and still taking doxycycline.Patient advised to continue taking doxycycline

## 2023-06-23 IMAGING — MG MAMMOGRAPHY SCREENING BILATERAL 3[PERSON_NAME]
8 series · 8 of 24 positions shown · non-contrast
Comparison: Comparison was made to prior examinations.

________________________________________________________________________________________________ 
MAMMOGRAPHY SCREENING BILATERAL 3[facility], 06/23/2023 [DATE]: 
CLINICAL INDICATION: Encounter for screening mammogram.
TECHNIQUE: Digital bilateral mammograms and 3-D Tomosynthesis were obtained. 
These were interpreted both primarily and with the aid of computer-aided 
detection system.  
BREAST DENSITY: (Level B) There are scattered areas of fibroglandular density.

[R CC]
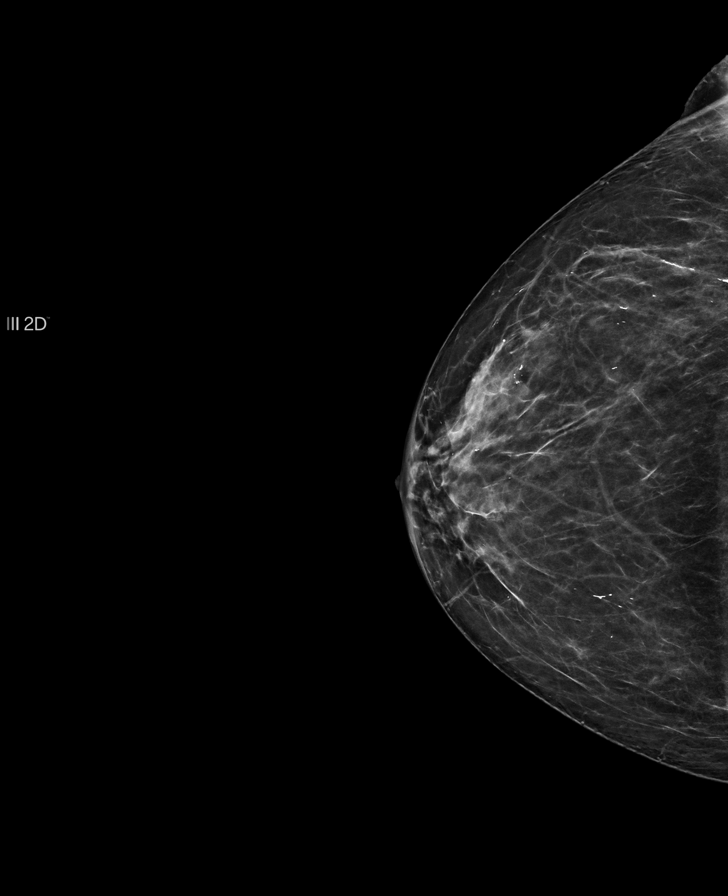

[L MLO]
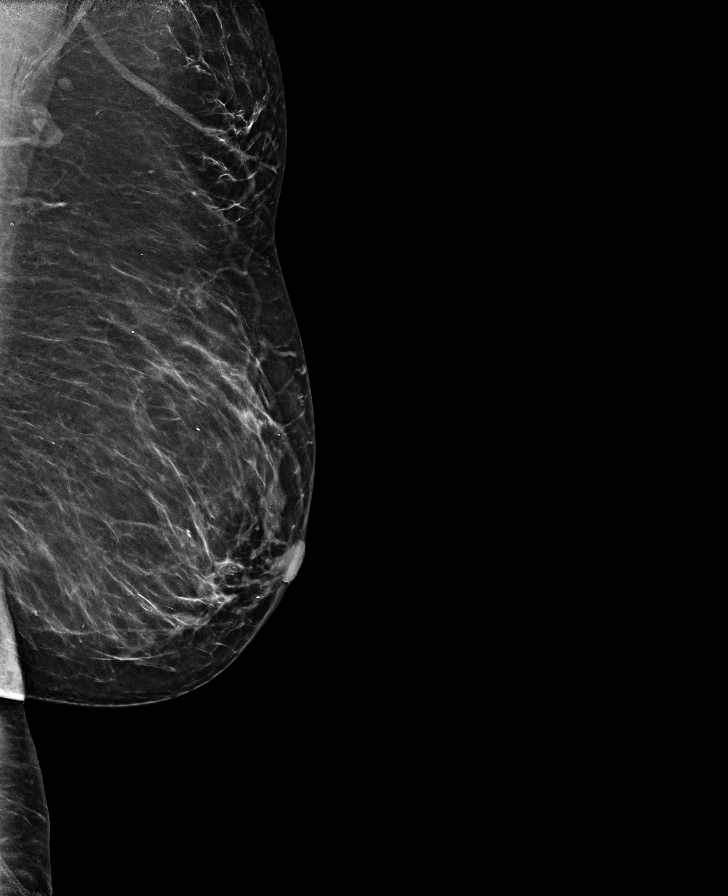

[R MLO]
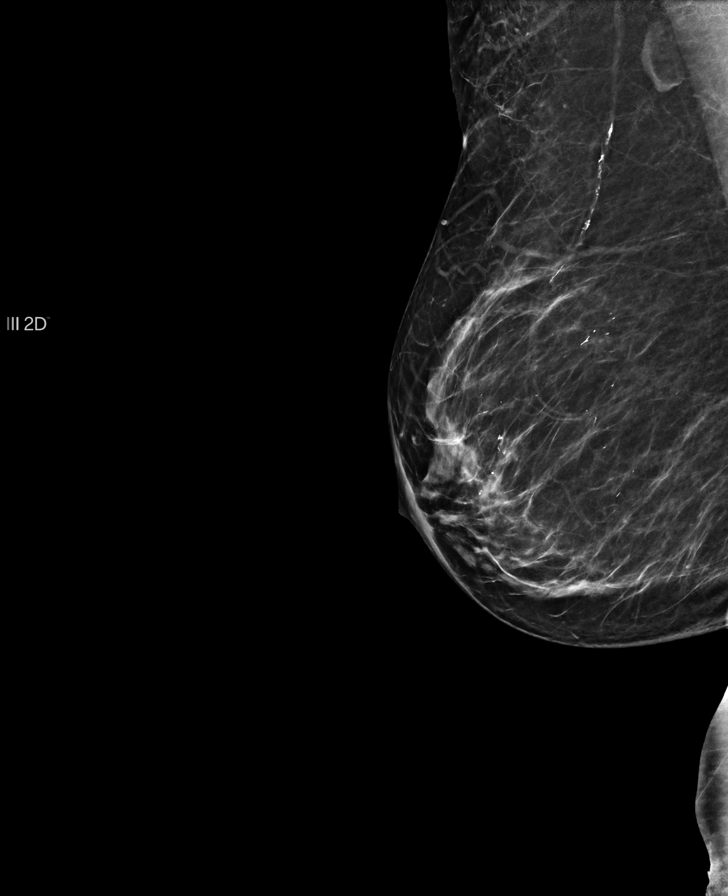

[L CC]
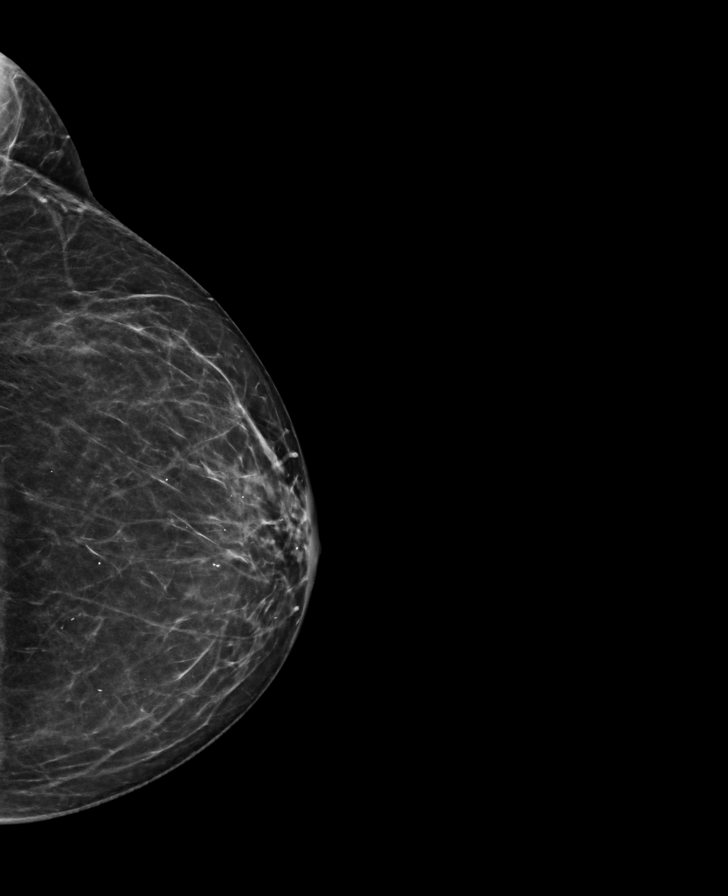

[R CC tomo · tomo slice 11/20.0]
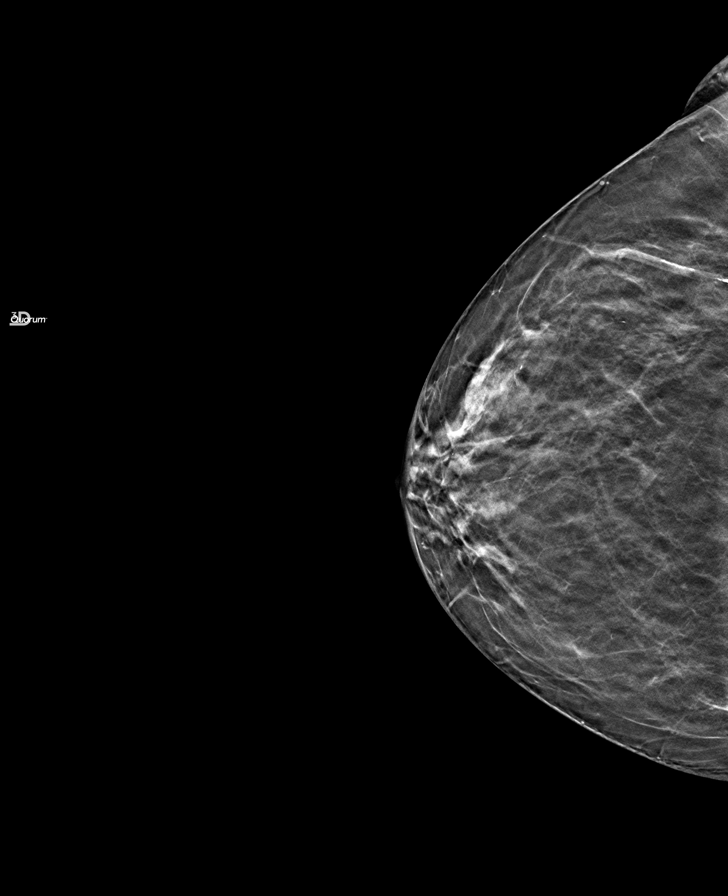

[R MLO tomo · tomo slice 12/23.0]
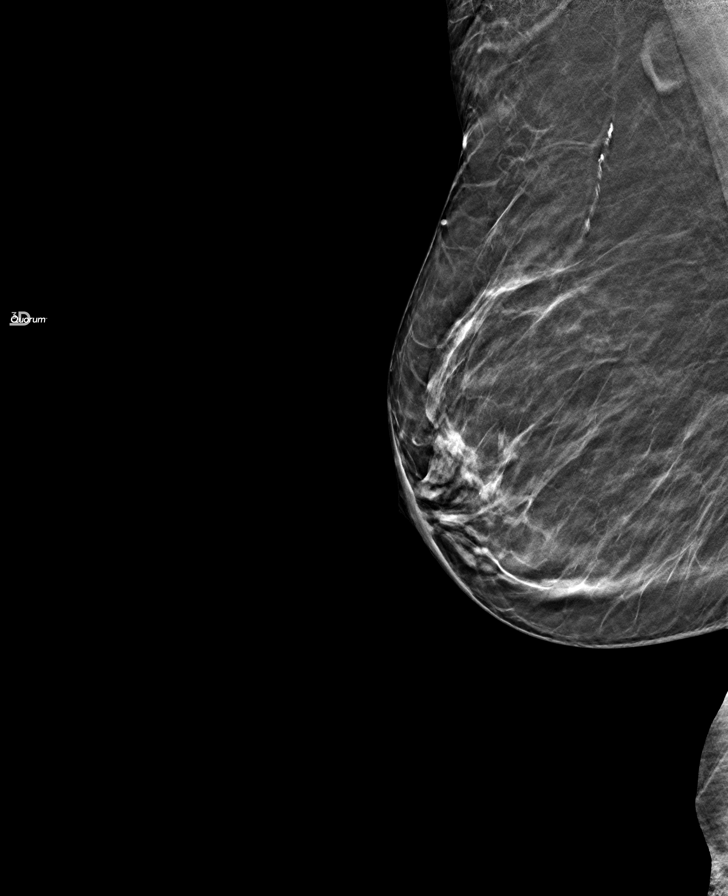

[L MLO tomo · tomo slice 13/25.0]
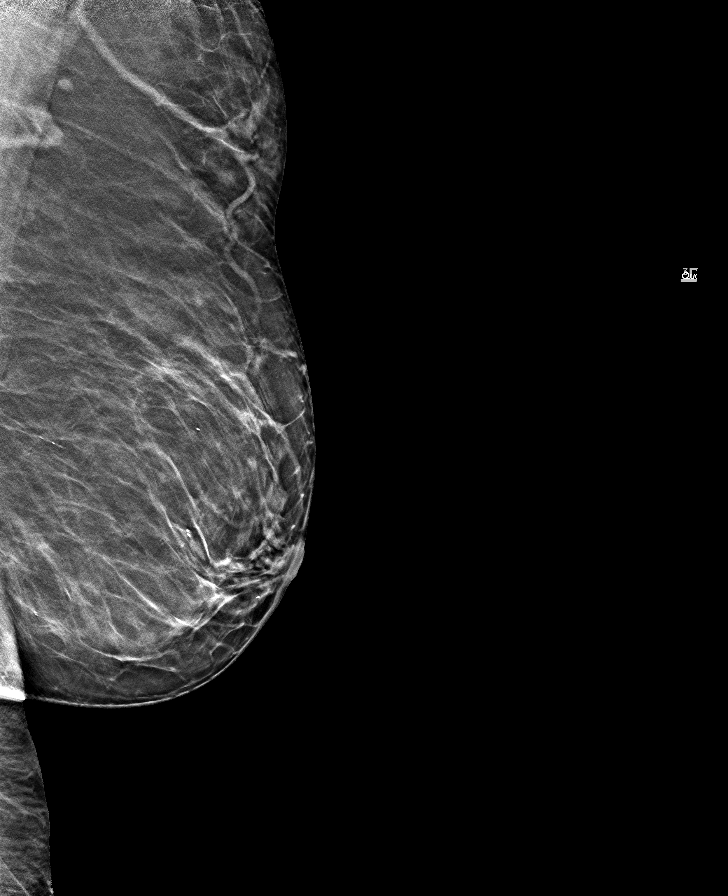

[L CC tomo · tomo slice 11/21.0]
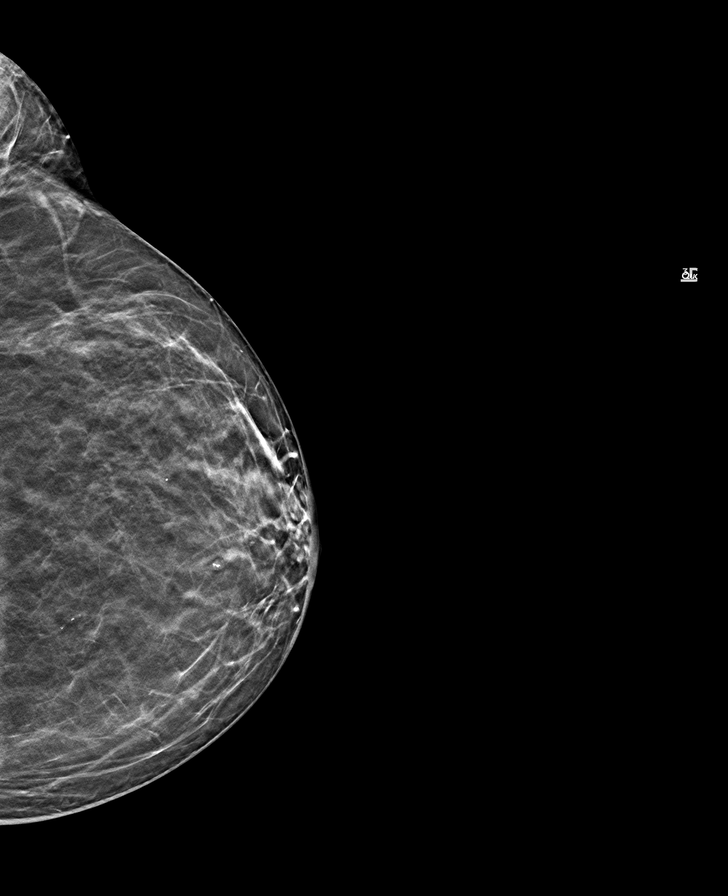

[8 of 24 positions shown; findings below may reference images not displayed]

FINDINGS: No suspicious mass, calcifications, or area of architectural 
distortion in either breast.
IMPRESSION: Stable exam. 
(BI-RADS 2) Benign findings. Routine mammographic follow-up is recommended.

## 2023-07-15 IMAGING — DX CLAVICLE LEFT 2 VIEWS
2 series · 2 of 2 positions shown · non-contrast
Comparison: None.

________________________________________________________________________________________________ 
CLAVICLE LEFT 2 VIEWS, 07/15/2023 [DATE]: 
CLINICAL INDICATION: Pain in unspecified shoulder.

[AP (1 of 2)]
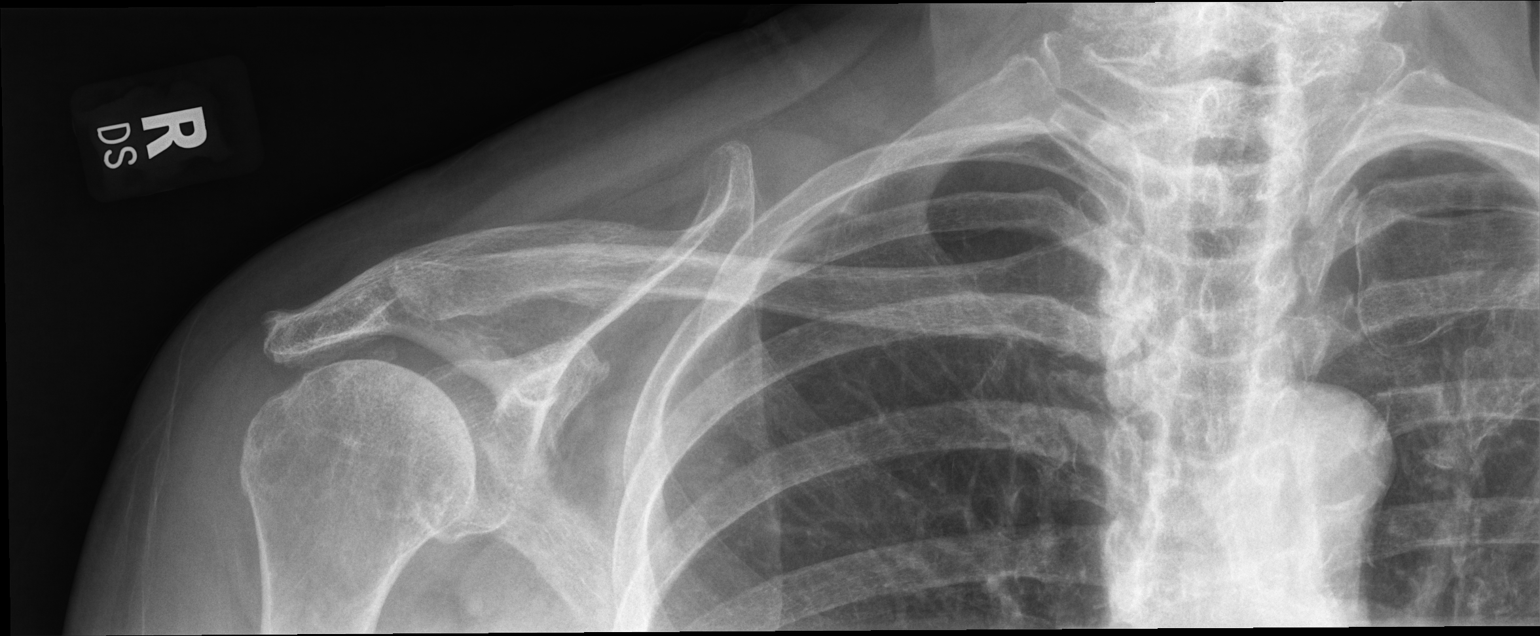

[AP (2 of 2)]
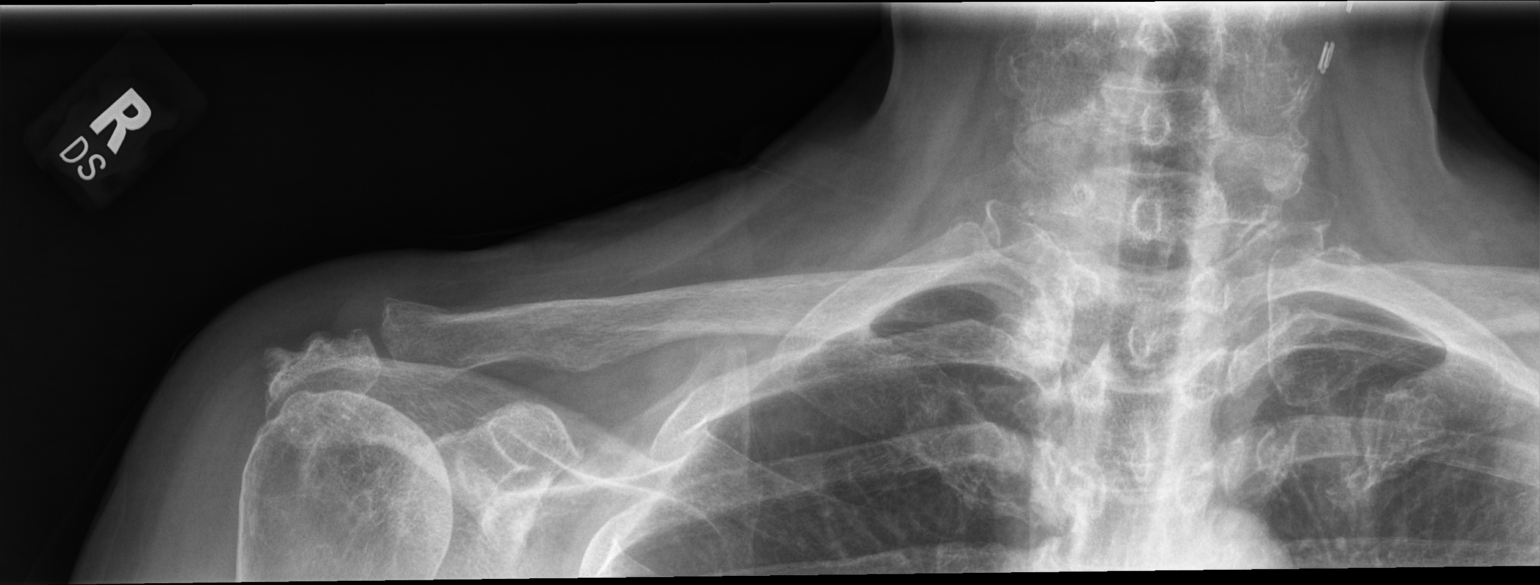

[2 of 2 positions shown; findings below may reference images not displayed]

FINDINGS: No fracture. Narrowed (0.3 cm) acromiohumeral interval, consistent 
with rotator cuff pathology. Mild sternoclavicular, AC and glenohumeral joint 
degenerative change. Osteopenia. Left cervical clips.
IMPRESSION: 1.  Mild degenerative change and rotator cuff pathology. 
2.  Osteopenia: DXA with TBS (trabecular bone score) may be helpful for further 
evaluation.

## 2023-10-03 IMAGING — MR MRI CERVICAL SPINE WITHOUT CONTRAST
7 of 12 series · 11 of 48 positions shown · IV contrast (gadolinium)
Comparison: None.

________________________________________________________________________________________________ 
MRI CERVICAL SPINE WITHOUT CONTRAST, 10/03/2023 [DATE]: 
CLINICAL INDICATION: Neck pain with radiation down right arm to the hand/finger.
TECHNIQUE: Multiplanar, multiecho position MR images of the cervical spine were 
performed without intravenous gadolinium enhancement. Patient was scanned on a 
1.5T magnet.

[Series 401: survey · axial · 10.0mm · 1.25mm/px · z∈[-6,+200]mm · 2 of 10 slices shown]
[im 1/10]
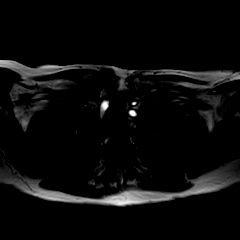
[im 10/10]
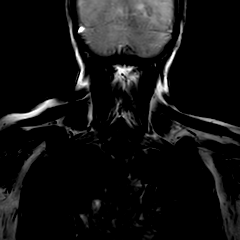

[Series 501: t2w_cor-surv · coronal · 5.0mm · 0.69mm/px · 1 of 7 slices shown]
[im 1/7]
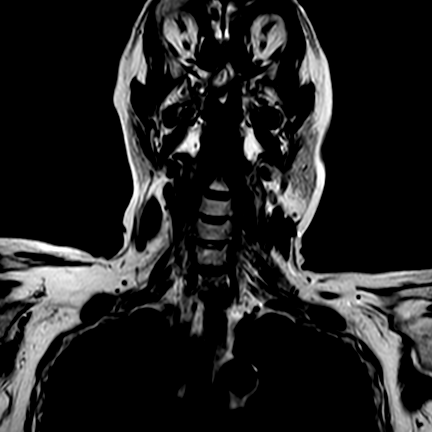

[Series 601: T1 · sagittal · 3.0mm · 0.39mm/px · 1 of 15 slices shown]
[im 1/15]
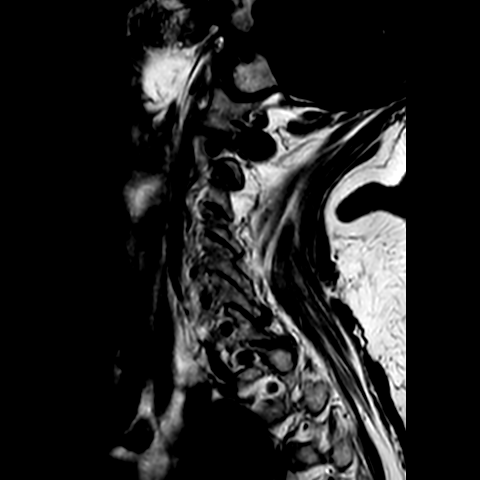

[Series 702: (id)_mdixon_tse · sagittal · 3.0mm · 0.35mm/px · 1 of 15 slices shown]
[im 1/15]
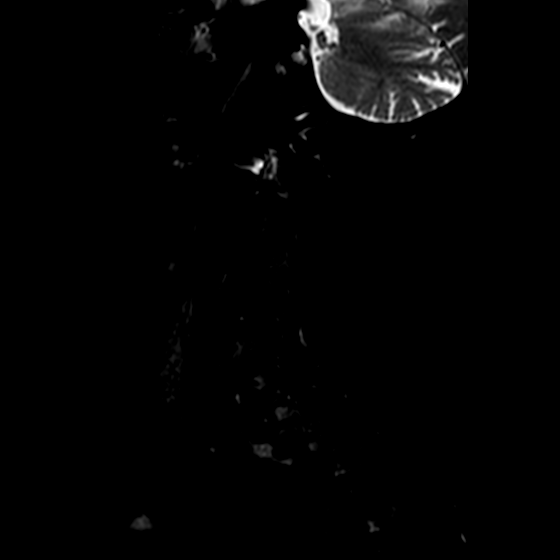

[Series 703: st2w_mdixon_tse · sagittal · 3.0mm · 0.35mm/px · 1 of 15 slices shown]
[im 1/15]
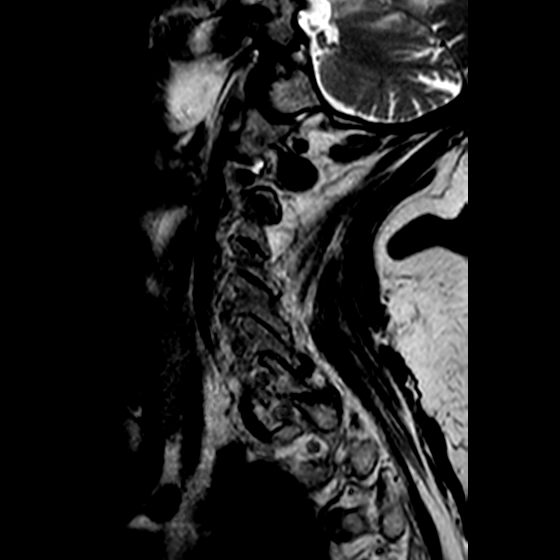

[Series 804: csp left oblq · oblique · 1.0mm · 0.15mm/px · 3 of 36 slices shown]
[im 1/36]
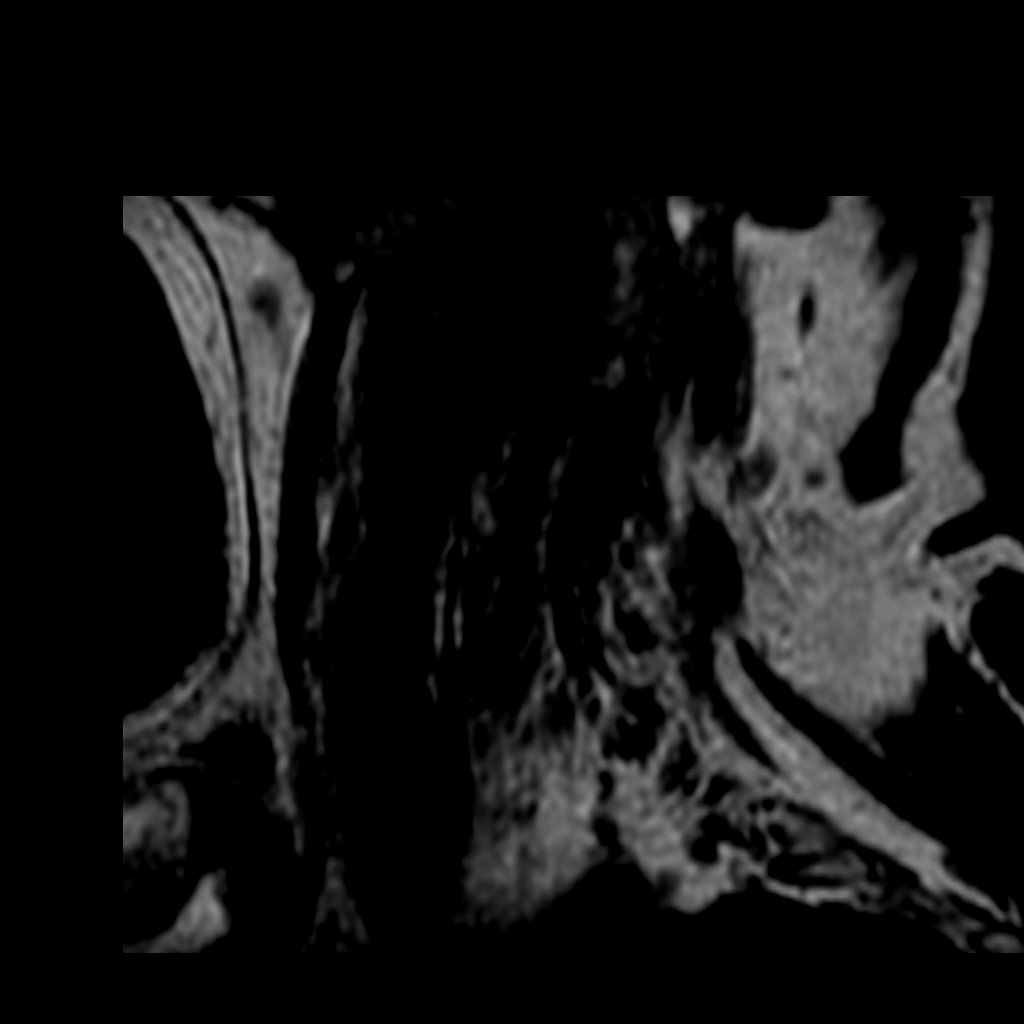
[im 18/36]
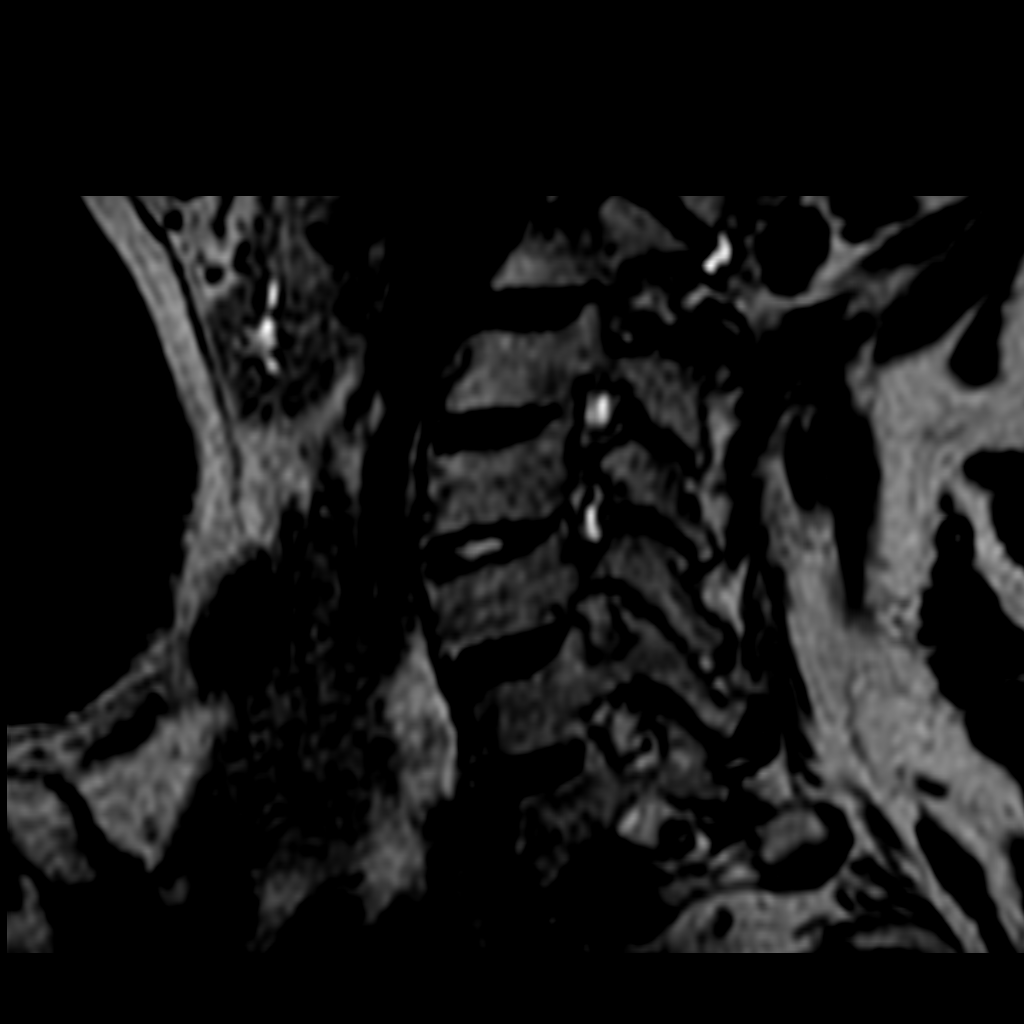
[im 36/36]
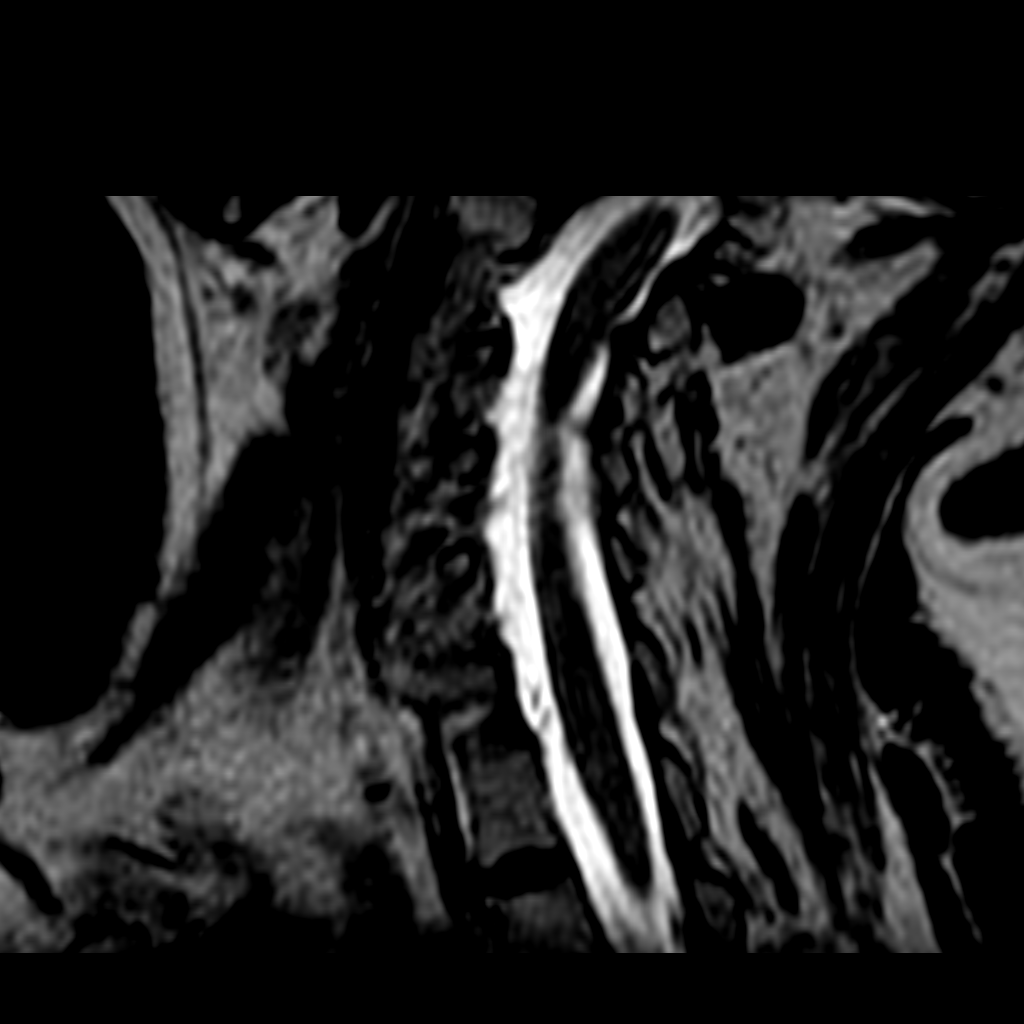

[Series 901: T2 · oblique · 3.0mm · 0.29mm/px · 2 of 18 slices shown]
[im 1/18]
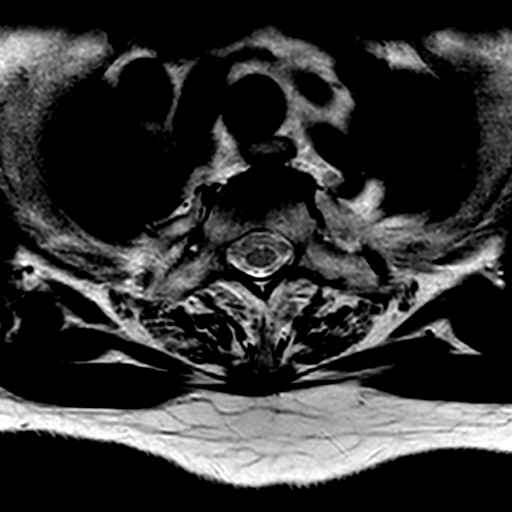
[im 18/18]
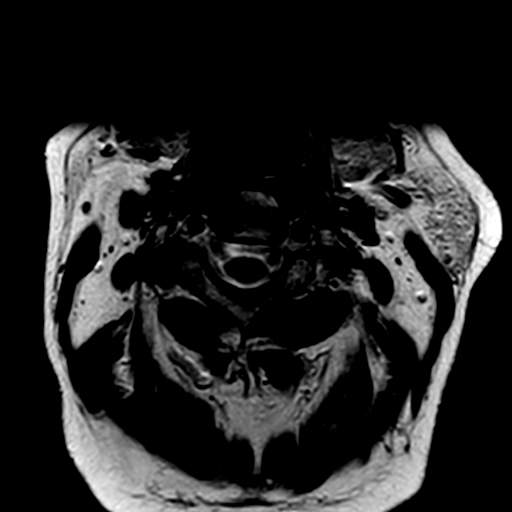

[11 of 48 positions shown; findings below may reference images not displayed]

FINDINGS: -------------------------------------------------------------------------------- 
----------------- 
GENERAL: 
ALIGNMENT: Mild retrolisthesis of C2 on C3, C3 on C4. Mild anterolisthesis of T1 
on T2. 
VERTEBRAL BODY HEIGHT: Normal.  
MARROW SIGNAL: No focal suspect signal abnormality. 
CORD SIGNAL: Normal.  
ADDITIONAL FINDINGS: None. 
-------------------------------------------------------------------------------- 
---------------- 
SEGMENTAL: 
CRANIOCERVICAL JUNCTION: Ligamentum flavum hypertrophy with effacement of dorsal 
CSF space and mild central canal narrowing. 
C2-C3: Disc osteophyte complex eccentric to the left, right greater than left 
facet hypertrophy, ligamentum flavum hypertrophy; mild central canal narrowing 
with mild deformity of the dorsal cord.  No significant right neural foraminal 
narrowing. No significant left neural foraminal narrowing.  
C3-C4: Osseous fusion across right facet joint, bilateral facet hypertrophic 
change; no significant central canal narrowing.  No significant right neural 
foraminal narrowing. No significant left neural foraminal narrowing.  
C4-C5: Bilateral facet hypertrophy with regions of osseous fusion across right 
facet joint; no significant central canal narrowing.  No significant right 
neural foraminal narrowing. No significant left neural foraminal narrowing.  
C5-C6: Minimal disc osteophyte complex with bilateral facet hypertrophy, areas 
of osseous fusion across bilateral facet joints; no significant central canal 
narrowing.  No significant right neural foraminal narrowing. No significant left 
neural foraminal narrowing.  
C6-C7: Disc osteophyte complex with bilateral facet hypertrophy; very mild 
central canal narrowing.  No significant right neural foraminal narrowing. No 
significant left neural foraminal narrowing.  
C7-T1: Left facet hypertrophy, no significant central canal narrowing.  No 
significant right neural foraminal narrowing. No significant left neural 
foraminal narrowing.  
-------------------------------------------------------------------------------- 
---------------
IMPRESSION: 1.  Discogenic/degenerative changes as above. 
2.  Cord signal abnormality: None. 
3.  Cord deformity: C2-C3

## 2023-10-03 IMAGING — MR MRI CHEST WITHOUT CONTRAST
4 of 6 series · 25 of 48 positions shown · IV contrast (gadolinium)
Comparison: Radiographs of the clavicle of 07/15/2023. CT examination of the neck 
of 07/01/2017.

________________________________________________________________________________________________ 
MRI CHEST WITHOUT CONTRAST, 10/03/2023 [DATE]: 
CLINICAL INDICATION: Right sternoclavicular joint swelling/prominence. Neck pain 
which radiates to right arm with finger numbness x2 months. Pain right clavicle 
with right SC joint swelling times one year. Painful to touch when turns head.
TECHNIQUE: Multiplanar, multiecho position MR images of the chest with attention 
to the sternoclavicular joints were performed without intravenous gadolinium 
enhancement. Patient was scanned on a 1.5T magnet.

[Series 301: survey mst · axial · 10.0mm · 0.83mm/px · z∈[-6,+245]mm · 5 of 17 slices shown]
[im 1/17]
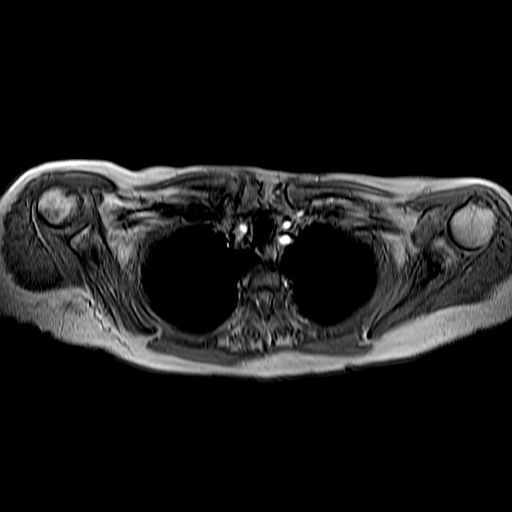
[im 5/17]
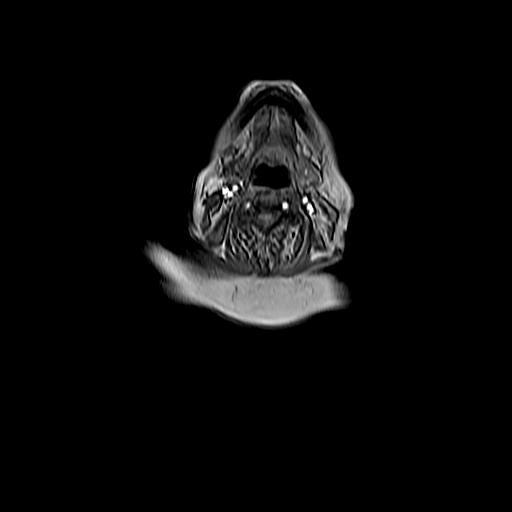
[im 9/17]
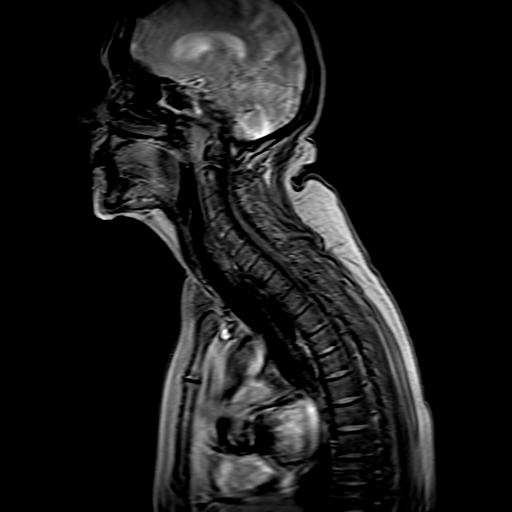
[im 13/17]
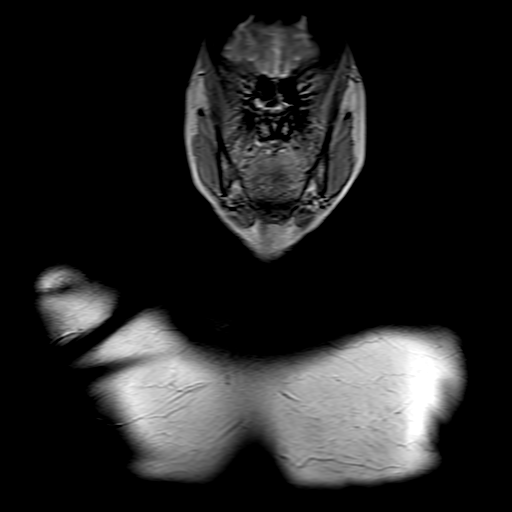
[im 17/17]
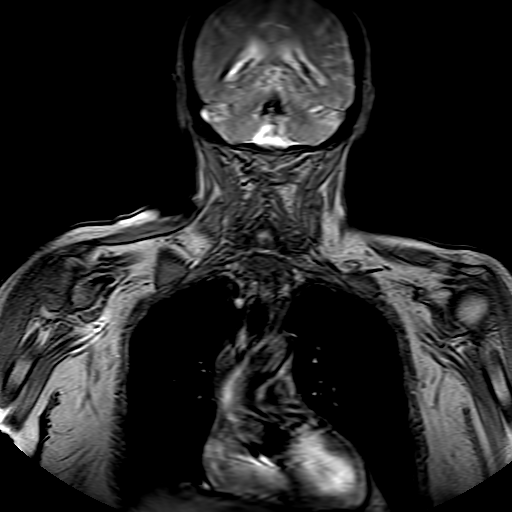

[Series 1101: t1w_mv · axial · 5.0mm · 0.74mm/px · z∈[-65,+67]mm · 8 of 23 slices shown]
[im 1/23]
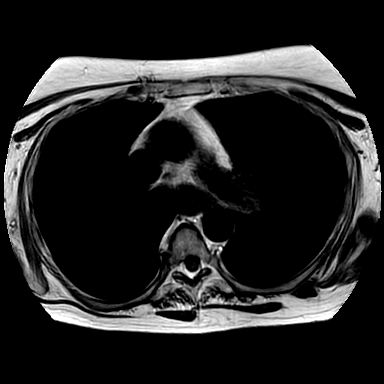
[im 4/23]
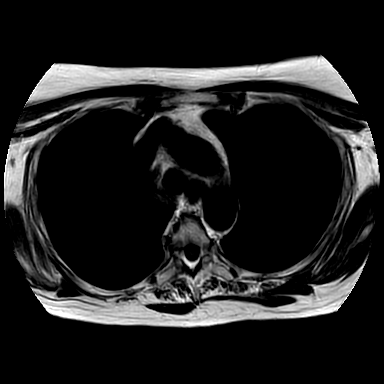
[im 7/23]
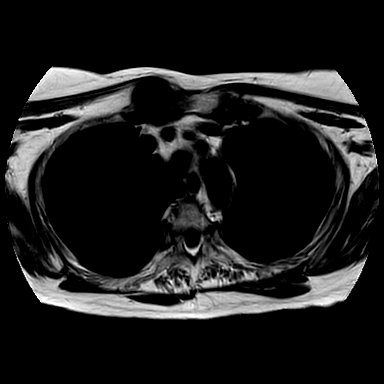
[im 10/23]
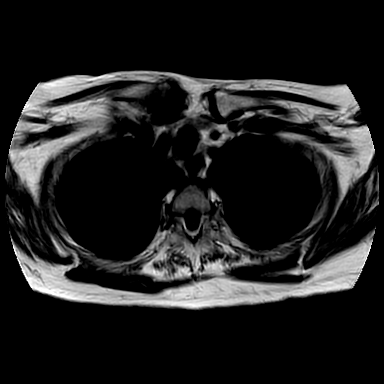
[im 13/23]
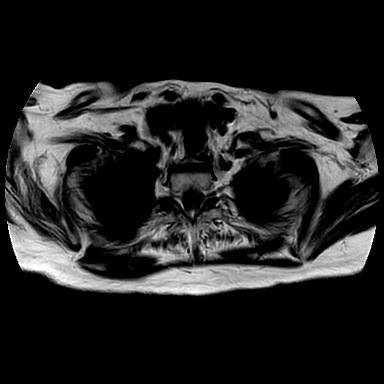
[im 16/23]
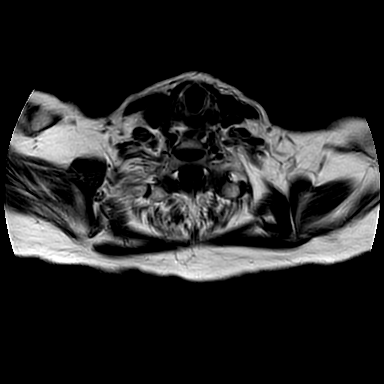
[im 19/23]
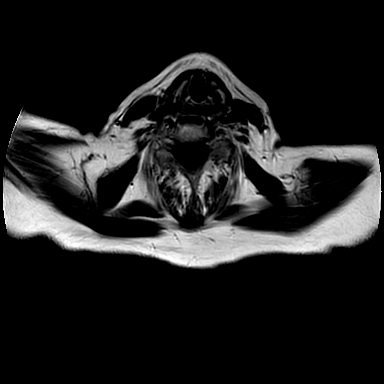
[im 23/23]
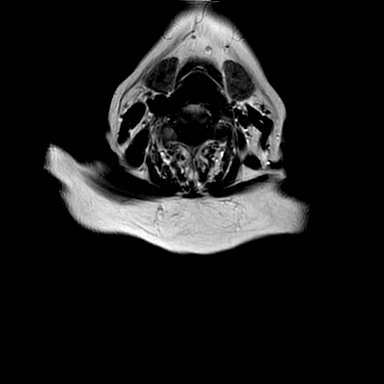

[Series 1201: stir_(person_name) fov · axial · 5.0mm · 0.74mm/px · z∈[-65,+43]mm · 4 of 23 slices shown]
[im 1/23]
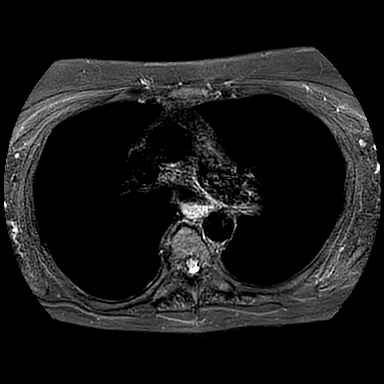
[im 4/23]
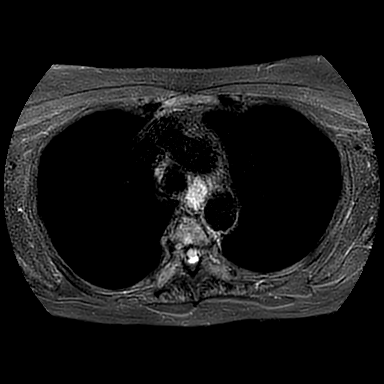
[im 13/23]
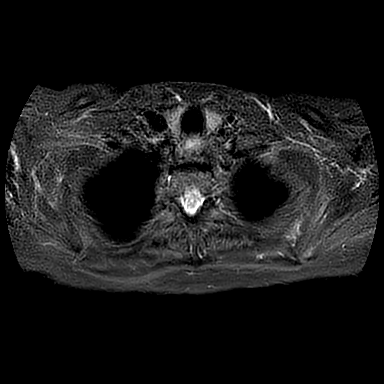
[im 19/23]
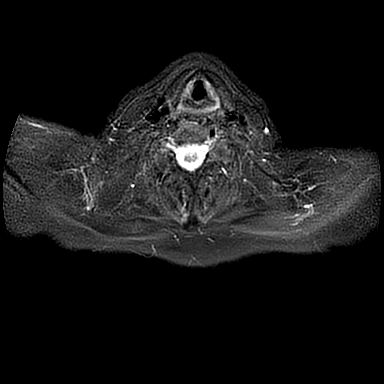

[Series 1301: T1 · sagittal · 4.0mm · 0.62mm/px · 8 of 30 slices shown]
[im 1/30]
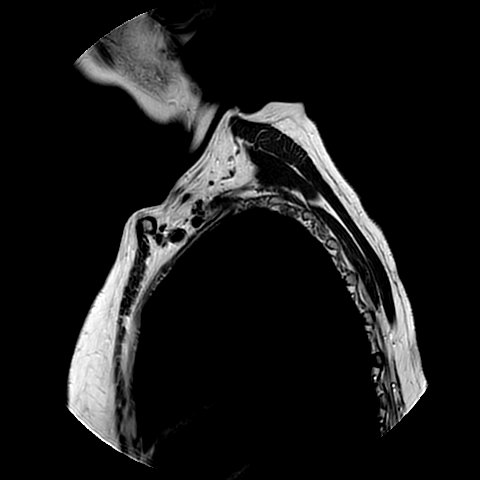
[im 4/30]
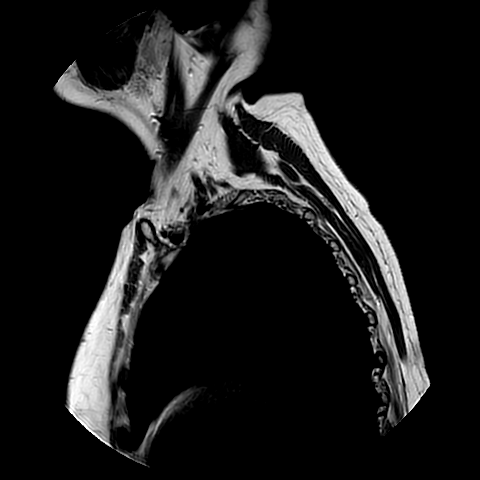
[im 10/30]
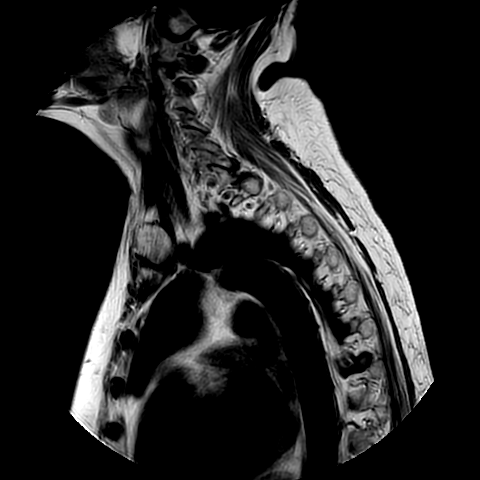
[im 13/30]
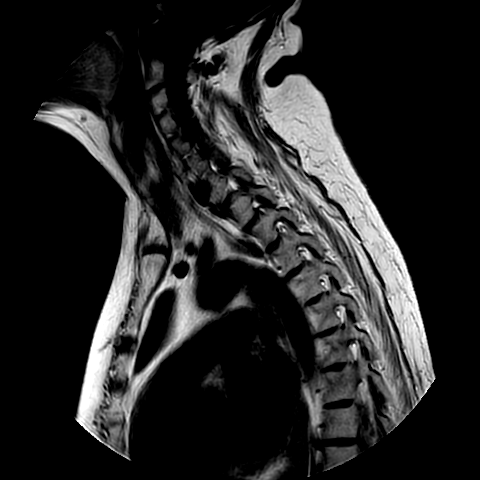
[im 17/30]
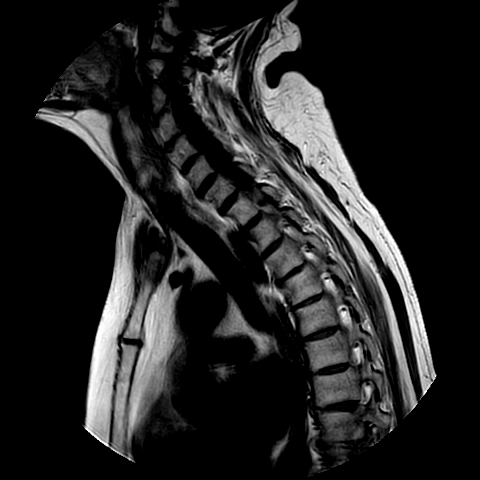
[im 20/30]
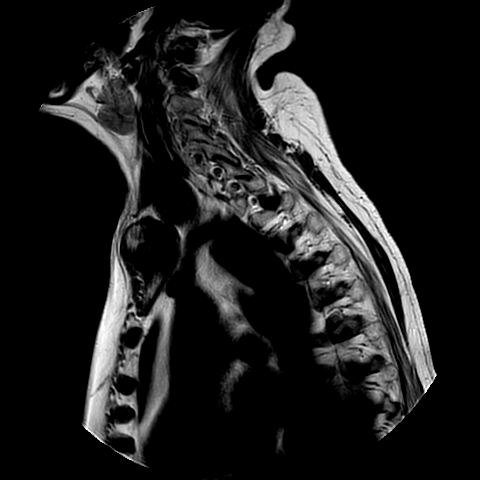
[im 26/30]
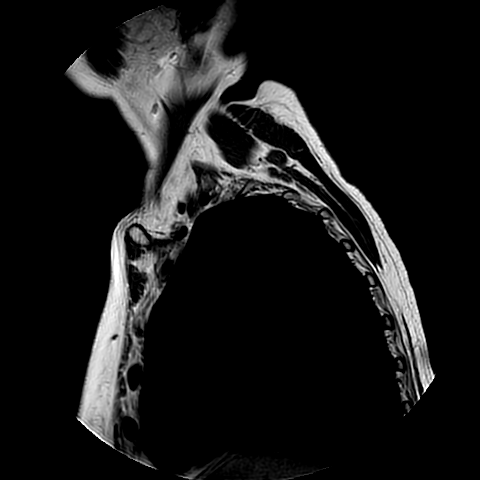
[im 30/30]
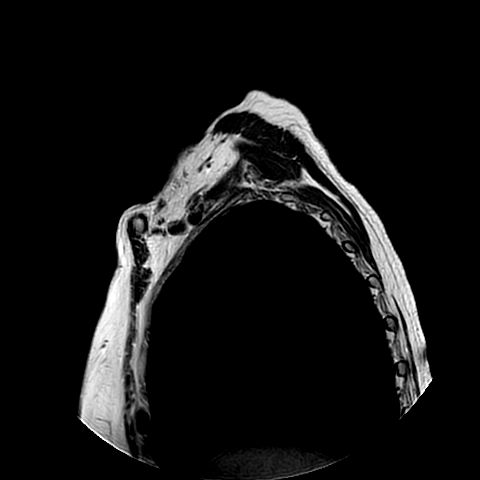

[25 of 48 positions shown; findings below may reference images not displayed]

FINDINGS: MUSCULOSKELETAL: There is asymmetric widening of fluid within the right 
sternoclavicular joint. There is adjacent marrow edema like signal intensity 
within the clavicle and sternum about the sternoclavicular joint. Hypertrophic 
changes involve both sternoclavicular joints. No erosion. No MR findings 
specific for acute osteomyelitis. Mild spondylotic changes included cervical 
thoracic spine. 
LUNGS: Lungs are clear.  
MEDIASTINUM: Included vasculature is negative. No mediastinal mass.
IMPRESSION: Sternoclavicular joint arthritis, asymmetrically greater on the right with fluid 
within the sternoclavicular joint and surrounding reactive marrow edema.

## 2024-05-31 ENCOUNTER — Ambulatory Visit (INDEPENDENT_AMBULATORY_CARE_PROVIDER_SITE_OTHER): Admitting: Family Medicine

## 2024-05-31 ENCOUNTER — Encounter (INDEPENDENT_AMBULATORY_CARE_PROVIDER_SITE_OTHER): Payer: Self-pay | Admitting: Family Medicine

## 2024-05-31 VITALS — BP 117/76 | HR 73 | Temp 98.0°F | Resp 19 | Ht 63.0 in | Wt 154.8 lb

## 2024-05-31 DIAGNOSIS — R058 Other specified cough: Secondary | ICD-10-CM

## 2024-05-31 DIAGNOSIS — J069 Acute upper respiratory infection, unspecified: Secondary | ICD-10-CM

## 2024-05-31 DIAGNOSIS — B349 Viral infection, unspecified: Secondary | ICD-10-CM

## 2024-05-31 LAB — POCT INFLUENZA A/B
POCT Rapid Influenza A AG: NEGATIVE
POCT Rapid Influenza B AG: NEGATIVE

## 2024-05-31 LAB — ABBOTT BINAX NOW COVID-19 ANTIGEN: BinaxNOW SARS COV2 Antigen POCT: NEGATIVE

## 2024-05-31 LAB — STREP A POCT GOHEALTH: Rapid Strep A Screen POCT: NEGATIVE

## 2024-05-31 NOTE — Patient Instructions (Signed)
 It was a pleasure taking care of you. I am committed to ensuring the well-being of my patients and I am glad to have had the opportunity to serve you.    Rest. Have sips of warm drinks.  For the cough and congestion, may take over the counter Coricidin HBP as directed on the package.  You may also use OTC Flonase nose spray as directed, 1 spray in each nostril twice a day, pointing inside the nostril towards the closer ear.  For the fever or achiness, may take OTC Tylenol or Ibuprofen  with food as directed.    Follow-up in 3-5 days, early if worse.     Thank you for entrusting your care to us . I wish you a smooth and speedy recovery.

## 2024-06-02 ENCOUNTER — Encounter (INDEPENDENT_AMBULATORY_CARE_PROVIDER_SITE_OTHER): Payer: Self-pay | Admitting: Family Medicine

## 2024-06-02 NOTE — Progress Notes (Signed)
 [facility]  OFFICE NOTE         Subjective   Historian: Patient      Chief Complaint   Patient presents with    Cough     Pt have cough, running nose.7days no otc meds today       Cough      Brenda Stevens is a 78 y.o. female who presents for upper respiratory infection for 1.5 weeks. Mucus is still clear.    Denies any other significant complaints.    History:  Medications and Allergies reviewed.   Pertinent Past Medical, Surgical, Family and Social History were reviewed.        Objective     Vitals:    05/31/24 1129   BP: 117/76   BP Site: Right arm   Patient Position: Sitting   Cuff Size: Medium   Pulse: 73   Resp: 19   Temp: 98 F (36.7 C)   TempSrc: Tympanic   SpO2: 98%   Weight: 70.2 kg (154 lb 12.8 oz)   Height: 1.6 m (5' 3)     Physical Exam  Constitutional:       General: She is not in acute distress.     Appearance: Normal appearance. She is well-developed.   HENT:      Head: Normocephalic and atraumatic.      Right Ear: Tympanic membrane, ear canal and external ear normal.      Left Ear: Tympanic membrane, ear canal and external ear normal.      Nose: Congestion present.      Mouth/Throat:      Mouth: Mucous membranes are moist.      Pharynx: Oropharynx is clear.     Eyes: Conjunctivae are normal. Pupils are equal, round, and reactive to light. Cardiovascular:      Rate and Rhythm: Normal rate and regular rhythm.      Pulses: Normal pulses.      Heart sounds: Normal heart sounds.   Pulmonary:      Effort: Pulmonary effort is normal.      Breath sounds: Normal breath sounds.   Musculoskeletal:      Cervical back: Normal range of motion and neck supple.   Neurological:      Mental Status: She is alert and oriented to person, place, and time.   Skin:     General: Skin is warm.   Vitals and nursing note reviewed.       Urgent Care Course   There were no labs reviewed with this patient during the visit.    There were no x-rays reviewed with this patient during the visit.      Procedures    Procedures     Assessment / Plan     Differential Diagnoses including but not limited to: Sinusitis, Bronchitis, Pharyngitis, Pneumonia, Influenza, COVID-19 and Allergic Rhinitis     PLAN:    Brenda Stevens was seen today for cough.    Diagnoses and all orders for this visit:    Acute upper respiratory infection, unspecified  -     BinaxNow SARS-COV-2 Antigen POCT; Future  -     BinaxNow SARS-COV-2 Antigen POCT    Other cough  -     Rapid Strep A POCT; Future  -     POCT Influenza A/B; Future  -     Rapid Strep A POCT  -     POCT Influenza A/B    Viral infection    Rest. Have sips  of warm drinks.  For the cough and congestion, may take over the counter Coricidin HBP as directed on the package.  You may also use OTC Flonase nose spray as directed, 1 spray in each nostril twice a day, pointing inside the nostril towards the closer ear.  For the fever or achiness, may take OTC Tylenol or Ibuprofen with food as directed.    Follow-up in 3-5 days, early if worse.      The indications for early follow-up with PCP and return to UC were discussed. Patient/family received education on the working diagnosis, diagnostic uncertainties, and proposed treatment plan. Indications for emergency evaluation in the ED were reviewed. Written and verbal discharge instructions were provided and discussed and all questions from the patient/family were addressed, with no apparent barriers.
